# Patient Record
Sex: Female | Born: 1960 | Race: White | Hispanic: No | Marital: Married | Smoking: Former smoker
Health system: Southern US, Community
[De-identification: ages and names within clinical notes are randomized; demographics above are authoritative.]

## PROBLEM LIST (undated history)

## (undated) DIAGNOSIS — E785 Hyperlipidemia, unspecified: Secondary | ICD-10-CM

## (undated) DIAGNOSIS — C439 Malignant melanoma of skin, unspecified: Secondary | ICD-10-CM

## (undated) HISTORY — PX: CARPAL TUNNEL RELEASE: SHX101

## (undated) HISTORY — PX: APPENDECTOMY: SHX54

## (undated) HISTORY — DX: Malignant melanoma of skin, unspecified: C43.9

---

## 2008-09-03 ENCOUNTER — Emergency Department (HOSPITAL_COMMUNITY): Admission: EM | Admit: 2008-09-03 | Discharge: 2008-09-03 | Payer: Self-pay | Admitting: Emergency Medicine

## 2009-01-20 ENCOUNTER — Emergency Department (HOSPITAL_COMMUNITY): Admission: EM | Admit: 2009-01-20 | Discharge: 2009-01-20 | Payer: Self-pay | Admitting: Emergency Medicine

## 2009-01-23 ENCOUNTER — Encounter (HOSPITAL_COMMUNITY): Admission: RE | Admit: 2009-01-23 | Discharge: 2009-04-23 | Payer: Self-pay | Admitting: Emergency Medicine

## 2010-08-14 ENCOUNTER — Emergency Department (HOSPITAL_BASED_OUTPATIENT_CLINIC_OR_DEPARTMENT_OTHER): Admission: EM | Admit: 2010-08-14 | Discharge: 2010-08-14 | Payer: Self-pay | Admitting: Emergency Medicine

## 2011-09-15 ENCOUNTER — Other Ambulatory Visit (HOSPITAL_COMMUNITY)
Admission: RE | Admit: 2011-09-15 | Discharge: 2011-09-15 | Disposition: A | Discharge status: Home / Self Care | Payer: BC Managed Care – PPO | Source: Ambulatory Visit | Attending: Internal Medicine | Admitting: Internal Medicine

## 2011-09-15 DIAGNOSIS — Z01419 Encounter for gynecological examination (general) (routine) without abnormal findings: Secondary | ICD-10-CM | POA: Insufficient documentation

## 2011-11-16 ENCOUNTER — Telehealth: Payer: Self-pay | Admitting: Internal Medicine

## 2011-11-16 ENCOUNTER — Encounter: Payer: Self-pay | Admitting: Internal Medicine

## 2011-11-16 ENCOUNTER — Ambulatory Visit (INDEPENDENT_AMBULATORY_CARE_PROVIDER_SITE_OTHER): Payer: Self-pay | Admitting: Internal Medicine

## 2011-11-16 VITALS — BP 104/82 | HR 69 | Temp 98.3°F | Ht 64.0 in | Wt 159.4 lb

## 2011-11-16 DIAGNOSIS — R490 Dysphonia: Secondary | ICD-10-CM

## 2011-11-16 DIAGNOSIS — J986 Disorders of diaphragm: Secondary | ICD-10-CM

## 2011-11-16 DIAGNOSIS — R0602 Shortness of breath: Secondary | ICD-10-CM

## 2011-11-16 NOTE — Patient Instructions (Signed)
Unclear what is causing your problems Please have CT chest and neck with contrast for hoarseness and left dipraghm elevation I will call you with results to decide next step

## 2011-11-16 NOTE — Telephone Encounter (Signed)
Contacted pt, she forgot to mention that she fell on left side x 1 month ago. States, a cxr was done on her left ankle and found nothing. Ibuprofen helps with pain. Still having sob and patient has a CT scan scheduled for tomorrow.  Please advise if ok to go back to work. Thanks, Huntley Dec

## 2011-11-16 NOTE — Progress Notes (Signed)
Subjective:    Patient ID: Virginia Richardson, female    DOB: 28-Apr-1961, 51 y.o.   MRN: [id number]  HPI  IOV 11/16/2011 51 year old female. Works in [employer]. No medical issues. Remote smoker - quit 1984 after 8ppd smoking hx.  Reports multiple visits to [facility] to attend to sick mom (UTIs).   Reports dyspnea x 1 month but has gained weight due to hospital food as visitor. Due to mom'sillness did not have time to seek medical attention. Insidious onset. 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 Improved with rest. Stable since onset. Denies associated cough, wheezing, sputum, orthopnea, paroxysmal nocturnal dyspnea, chest pain. However, past few days notices some discomfort in Left infra-axillary region when she exerts or some tightness when she inhales deeply. Also associated hoarseness since 1 month  Mom got out of [facility] on 11/11/11 after opd procedure with supra public foley. Then on 11/12/11 Saturday felt she might be getting acutely ill. Then next day 11/13/11 Sunday nausea, unable to eat, vomiting that persisted on 11/14/11. Yesterday 11/15/11 spent time taking bed rest but noticed after cleaning up old stew off refrigerator. After walking 193feeet for that felt that head was going to split with headache and had nausea. Says last several days not eaten much. Eating only crackers. Taking anti-nausea meds and is helping. Today only had mash potatoes. Of note no fever per hx since Monday 11/14/11  Saw NP Lauretta Chester yesterday and CXR 11/25/11 at DR Kim's office shows new LEFT DIAPHRAGM ELEVATION compared to feb 2012 which was normal at that time (does not remember why cxr was done 1 year ago). CBC was normal. BMET normal. DC on Zpak with referral here.   Social: 63 year old mom. 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  Family: 2 catts. No kids. Spouse + who is non-smoker   Review of Systems  Constitutional: Negative for fever and unexpected weight change.    HENT: Negative for ear pain, nosebleeds, congestion, sore throat, rhinorrhea, sneezing, trouble swallowing, dental problem, postnasal drip and sinus pressure.   Eyes: Negative for redness and itching.  Respiratory: Positive for shortness of breath. Negative for cough, chest tightness and wheezing.   Cardiovascular: Negative for palpitations and leg swelling.  Gastrointestinal: Positive for abdominal pain. Negative for nausea and vomiting.  Genitourinary: Negative for dysuria.  Musculoskeletal: Negative for joint swelling.  Skin: Negative for rash.  Neurological: Positive for headaches.  Hematological: Does not bruise/bleed easily.  Psychiatric/Behavioral: Positive for dysphoric mood. The patient is nervous/anxious.        Objective:   Physical Exam  Vitals reviewed. Constitutional: She is oriented to person, place, and time. She appears well-developed and well-nourished. No distress.       Body mass index is 27.36 kg/(m^2).   HENT:  Head: Normocephalic and atraumatic.  Right Ear: External ear normal.  Left Ear: External ear normal.  Mouth/Throat: Oropharynx is clear and moist. No oropharyngeal exudate.  Eyes: Conjunctivae and EOM are normal. Pupils are equal, round, and reactive to light. Right eye exhibits no discharge. Left eye exhibits no discharge. No scleral icterus.  Neck: Normal range of motion. Neck supple. No JVD present. No tracheal deviation present. No thyromegaly present.  Cardiovascular: Normal rate, regular rhythm, normal heart sounds and intact distal pulses.  Exam reveals no gallop and no friction rub.   No murmur heard. Pulmonary/Chest: Effort normal and breath sounds normal. No respiratory distress. She has no wheezes.  She has no rales. She exhibits no tenderness.       Left sided base chest sounds diminished and dull   Abdominal: Soft. Bowel sounds are normal. She exhibits no distension and no mass. There is no tenderness. There is no rebound and no guarding.   Musculoskeletal: Normal range of motion. She exhibits no edema and no tenderness.  Lymphadenopathy:    She has no cervical adenopathy.  Neurological: She is alert and oriented to person, place, and time. She has normal reflexes. No cranial nerve deficit. She exhibits normal muscle tone. Coordination normal.  Skin: Skin is warm and dry. No rash noted. She is not diaphoretic. No erythema. No pallor.  Psychiatric: She has a normal mood and affect. Her behavior is normal. Judgment and thought content normal.          Assessment & Plan:

## 2011-11-16 NOTE — Telephone Encounter (Signed)
Please change to CT angio chest rule out PE. Otherwise all same ct neck and ct chest. I tried to call her but got only voice mail and left message with details

## 2011-11-16 NOTE — Telephone Encounter (Signed)
Order placed for CT Angio Chest.  Will hold in triage to call pt tomorrow.  CT is closed.

## 2011-11-17 ENCOUNTER — Ambulatory Visit (INDEPENDENT_AMBULATORY_CARE_PROVIDER_SITE_OTHER)
Admission: RE | Admit: 2011-11-17 | Discharge: 2011-11-17 | Disposition: A | Discharge status: Home / Self Care | Payer: BC Managed Care – PPO | Source: Ambulatory Visit | Attending: Internal Medicine | Admitting: Internal Medicine

## 2011-11-17 ENCOUNTER — Other Ambulatory Visit: Payer: Self-pay

## 2011-11-17 ENCOUNTER — Telehealth: Payer: Self-pay | Admitting: Internal Medicine

## 2011-11-17 DIAGNOSIS — R490 Dysphonia: Secondary | ICD-10-CM

## 2011-11-17 DIAGNOSIS — J986 Disorders of diaphragm: Secondary | ICD-10-CM

## 2011-11-17 DIAGNOSIS — R0602 Shortness of breath: Secondary | ICD-10-CM

## 2011-11-17 MED ORDER — IOHEXOL 300 MG/ML  SOLN
80.0000 mL | Freq: Once | INTRAMUSCULAR | Status: AC | PRN
  Administered 2011-11-17: 80 mL via INTRAVENOUS

## 2011-11-17 NOTE — Telephone Encounter (Signed)
Order sent to [facility] for sniff test and referral to ENT

## 2011-11-17 NOTE — Telephone Encounter (Signed)
Triage,   Sending this to you in absence of Victorino Dike and me being out this pm and nature of testing. I informed husband of results because patient was driving.  Husbnad will talk to patient. 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 Recc laryngeal nerve area is normal G)  Left lower lobe atelectasis present.  Ground-glass opacities dependently in the lower lungs, likely  dependent atelectasis  PLAN (tirage to implement)   -set up sniff test for diaphragm left paralysis- radiology at [facility] or [place]  - ENT office eval [facility] or Dr Suszanne Conners ASAP - next few days if possible

## 2011-11-17 NOTE — Telephone Encounter (Signed)
Virginia Richardson is already aware of the change in CT's per phone call with [facility].    Called spoke with patient, she stated that she did receive MR's message about the change in CT's.  Nothing further needed.

## 2011-11-17 NOTE — Telephone Encounter (Signed)
Pt scheduled for sniff test@[facility]  11/18/11@12 :30pm and appt to see dr Berdine Addison  ent 11/21/11@9 :00am pt is aware of these appt

## 2011-11-17 NOTE — Telephone Encounter (Signed)
Per previous phone note from MR, he wanted pt set up for a sniff test and ENT referral asap, which i have already put both orders in to [facility].  Called and spoke with pt and informed her of this and that she will be getting a call from our [facility] with appt dates/times for these two orders.  Pt was ok with this but asked that PCCs call her directly on her cell [phone] with the information.  PCCs, please advise.  Thanks.

## 2011-11-18 ENCOUNTER — Ambulatory Visit (HOSPITAL_COMMUNITY)
Admission: RE | Admit: 2011-11-18 | Discharge: 2011-11-18 | Disposition: A | Discharge status: Home / Self Care | Payer: BC Managed Care – PPO | Source: Ambulatory Visit | Attending: Internal Medicine | Admitting: Internal Medicine

## 2011-11-18 ENCOUNTER — Other Ambulatory Visit: Payer: Self-pay | Admitting: Internal Medicine

## 2011-11-18 DIAGNOSIS — J986 Disorders of diaphragm: Secondary | ICD-10-CM | POA: Insufficient documentation

## 2011-11-18 DIAGNOSIS — R0602 Shortness of breath: Secondary | ICD-10-CM | POA: Insufficient documentation

## 2011-11-20 ENCOUNTER — Encounter: Payer: Self-pay | Admitting: Internal Medicine

## 2011-11-20 DIAGNOSIS — R490 Dysphonia: Secondary | ICD-10-CM | POA: Insufficient documentation

## 2011-11-20 DIAGNOSIS — J986 Disorders of diaphragm: Secondary | ICD-10-CM | POA: Insufficient documentation

## 2011-11-20 NOTE — Assessment & Plan Note (Signed)
Unclear why she woould have left diaphragm elevation. No obvious etiology. DEfinitely new since cxr 1 year ago. There is associated hoarseness too making me wonder about associated vocal cord issues. I think this is what is making her dyspneic. Not sure at this stage how to tie in nausea and gi symptoms to this; electrolytes at pmd office were normal esp sodium.. Will get Ct chest and neck and decide next step. I have explained uncertainties and broad differential diagnosis in detail to her

## 2011-11-21 ENCOUNTER — Telehealth: Payer: Self-pay | Admitting: Internal Medicine

## 2011-11-21 NOTE — Telephone Encounter (Signed)
I spoke with the pt and advised her of sniff test results. She states she saw the ENT today and asked them to fax note to [place]. She also had questions about wether she should return to work. She states she does not need a note because she is already set to be out but she was just wondering if she should go back since she works at [employer] and it is strenuous activity all day and she is short of breath. I advised if she is having symptoms then ok to stay out of work until OV on wed. Pt set to see MR on 11-23-11. [name], CMA

## 2011-11-23 ENCOUNTER — Encounter: Payer: Self-pay | Admitting: Internal Medicine

## 2011-11-23 ENCOUNTER — Ambulatory Visit (INDEPENDENT_AMBULATORY_CARE_PROVIDER_SITE_OTHER): Payer: BC Managed Care – PPO | Admitting: Internal Medicine

## 2011-11-23 VITALS — BP 124/76 | HR 83 | Temp 97.0°F | Ht 64.0 in | Wt 162.0 lb

## 2011-11-23 DIAGNOSIS — S142XXA Injury of nerve root of cervical spine, initial encounter: Secondary | ICD-10-CM

## 2011-11-23 DIAGNOSIS — R490 Dysphonia: Secondary | ICD-10-CM

## 2011-11-23 DIAGNOSIS — J986 Disorders of diaphragm: Secondary | ICD-10-CM

## 2011-11-23 NOTE — Patient Instructions (Addendum)
I think you need MRI of the spine - I will talk to radiologist today and then order it I will touch base with Dr Selena Batten about yoru nausea and Dr Pollyann Kennedy My nurse will get Dr Lucky Rathke note  Stay off work till workup is complete Followup based on MRI Address insomnia at followup

## 2011-11-23 NOTE — Progress Notes (Signed)
Subjective:    Patient ID: Virginia Richardson, female    DOB: Mar 30, 1961, 51 y.o.   MRN: [id number]  HPI  OV 11/16/2011 51 year old female. Works in [employer]. No medical issues. Remote smoker - quit 1984 after 8ppd smoking hx.  Reports multiple visits to [facility] to attend to sick mom (UTIs).   Reports dyspnea x 1 month but has gained weight due to hospital food as visitor. Due to mom'sillness did not have time to seek medical attention. Insidious onset. 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 Improved with rest. Stable since onset. Denies associated cough, wheezing, sputum, orthopnea, paroxysmal nocturnal dyspnea, chest pain. However, past few days notices some discomfort in Left infra-axillary region when she exerts or some tightness when she inhales deeply. Also associated hoarseness since 1 month  Mom got out of [facility] on 11/11/11 after opd procedure with supra public foley. Then on 11/12/11 Saturday felt she might be getting acutely ill. Then next day 11/13/11 Sunday nausea, unable to eat, vomiting that persisted on 11/14/11. Yesterday 11/15/11 spent time taking bed rest but noticed after cleaning up old stew off refrigerator. After walking 166feeet for that felt that head was going to split with headache and had nausea. Says last several days not eaten much. Eating only crackers. Taking anti-nausea meds and is helping. Today only had mash potatoes. Of note no fever per hx since Monday 11/14/11  Saw NP Lauretta Chester yesterday and CXR 11/25/11 at DR Kim's office shows new LEFT DIAPHRAGM ELEVATION compared to feb 2012 which was normal at that time (does not remember why cxr was done 1 year ago). CBC was normal. BMET normal. DC on Zpak with referral here.   Social: 90 year old mom. 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  Family: 2 catts. No kids. Spouse + who is non-smoker    OV 11/23/2011  FU dyspnea: still dyspneic. Stil with nausea esp with exposure to  certain foods. No vomiting.  Saw Dr Pollyann Kennedy ENT and per her hx: ENT eval was normal (note not available). Now recollects: that on 10/15/11 tripped and fell missing stairs fell on left side outstretched hand. Initially left ankle sprain and saw Dr Selena Batten 10/17/11 for left ankle sprain (xray negative per hx). Then around 10/24/11 woke up one morning and noticed left shoulder ache - sudden onset when she woke up. Underwent massage x 2 and chiropractor x 2 ( around xmas time to neck and back; felt crack noise). Pain still present but has moved to left supra-mammary area and husband feels this area is more pronounced. Dyspnea noticed after the fall but husband says intermittent nausea mild on and off several months.    Review of Systems  Constitutional: Positive for fever and unexpected weight change.  HENT: Positive for sore throat and sneezing. Negative for ear pain, nosebleeds, congestion, rhinorrhea, trouble swallowing, dental problem, postnasal drip and sinus pressure.   Eyes: Negative for redness and itching.  Respiratory: Positive for cough, chest tightness, shortness of breath and wheezing.   Cardiovascular: Negative for palpitations and leg swelling.  Gastrointestinal: Positive for nausea and vomiting.  Genitourinary: Negative for dysuria.  Musculoskeletal: Negative for joint swelling.  Skin: Negative for rash.  Neurological: Positive for headaches.  Hematological: Does not bruise/bleed easily.  Psychiatric/Behavioral: Negative for dysphoric mood. The patient is nervous/anxious.        Objective:   Physical Exam  Vitals reviewed. Constitutional: She is oriented to person,  place, and time. She appears well-developed and well-nourished. No distress.  HENT:  Head: Normocephalic and atraumatic.  Right Ear: External ear normal.  Left Ear: External ear normal.  Mouth/Throat: Oropharynx is clear and moist. No oropharyngeal exudate.  Eyes: Conjunctivae and EOM are normal. Pupils are equal, round,  and reactive to light. Right eye exhibits no discharge. Left eye exhibits no discharge. No scleral icterus.  Neck: Normal range of motion. Neck supple. No JVD present. No tracheal deviation present. No thyromegaly present.  Cardiovascular: Normal rate, regular rhythm, normal heart sounds and intact distal pulses.  Exam reveals no gallop and no friction rub.   No murmur heard. Pulmonary/Chest: Effort normal. No respiratory distress. She has no wheezes. She has no rales. She exhibits no tenderness.       Decreased breath sound on left  Abdominal: Soft. Bowel sounds are normal. She exhibits no distension and no mass. There is no tenderness. There is no rebound and no guarding.  Musculoskeletal: Normal range of motion. She exhibits no edema and no tenderness.  Lymphadenopathy:    She has no cervical adenopathy.  Neurological: She is alert and oriented to person, place, and time. She has normal reflexes. No cranial nerve deficit. She exhibits normal muscle tone. Coordination normal.  Skin: Skin is warm and dry. No rash noted. She is not diaphoretic. No erythema. No pallor.  Psychiatric: She has a normal mood and affect. Her behavior is normal. Judgment and thought content normal.          Assessment & Plan:

## 2011-11-24 ENCOUNTER — Ambulatory Visit (HOSPITAL_COMMUNITY)
Admission: RE | Admit: 2011-11-24 | Discharge: 2011-11-24 | Disposition: A | Discharge status: Home / Self Care | Payer: BC Managed Care – PPO | Source: Ambulatory Visit | Attending: Internal Medicine | Admitting: Internal Medicine

## 2011-11-24 DIAGNOSIS — S142XXA Injury of nerve root of cervical spine, initial encounter: Secondary | ICD-10-CM

## 2011-11-24 DIAGNOSIS — M542 Cervicalgia: Secondary | ICD-10-CM | POA: Insufficient documentation

## 2011-11-24 DIAGNOSIS — M5380 Other specified dorsopathies, site unspecified: Secondary | ICD-10-CM | POA: Insufficient documentation

## 2011-11-24 DIAGNOSIS — J986 Disorders of diaphragm: Secondary | ICD-10-CM | POA: Insufficient documentation

## 2011-11-25 ENCOUNTER — Inpatient Hospital Stay (HOSPITAL_COMMUNITY): Admission: RE | Admit: 2011-11-25 | Payer: BC Managed Care – PPO | Source: Ambulatory Visit

## 2011-11-25 ENCOUNTER — Telehealth: Payer: Self-pay | Admitting: Internal Medicine

## 2011-11-25 ENCOUNTER — Encounter: Payer: Self-pay | Admitting: Internal Medicine

## 2011-11-25 DIAGNOSIS — R0600 Dyspnea, unspecified: Secondary | ICD-10-CM

## 2011-11-25 NOTE — Telephone Encounter (Signed)
Spouse Ron is aware that PFT has been ordered AND  Cardiopulmonary is closed for today. This will be set up on Mon., 1/21 and the PCC's will call with that date and time.

## 2011-11-25 NOTE — Telephone Encounter (Signed)
MRI essentuially normal. Tried to get hold of Dr Pollyann Kennedy and could not. Advised patient (d/w PMD Dr Selena Batten as well  1. Diaph paralysis  - idiopathic 2. Offered [facility] 2nd opinion with Dr Katrine Coho -she did not take it  3. Recommended rehab and weight loss - she is interested in it. Will refer to pulmonary rehab. She prefers to work at [employer] and do rehab 4. Nausea improving - wil follow with Dr Marzetta Board,  1. Please give appt to see me in 2-3 months 2. Refer to pulmonary rehab for dyspnea, diaph paralysis - mark urgent in referral with note that I want patient to move to top of queue for rehab 3. Please get copy of OV note of Dr Pollyann Kennedy (I called his office and none picked up) ENT  Thanks  MR

## 2011-11-25 NOTE — Assessment & Plan Note (Addendum)
Unclear etiology for left diaphragm weakness. Given hx of fall and neck manipulation will get MRI C Spine rule out C3 disease. Explained dx uncertainty and discussed with her and husband. Agreeable to plan. Unable to explain nausea to this; will defer to Dr Selena Batten   > 50% of this > 15 min visit spent in face to face counseling

## 2011-11-25 NOTE — Telephone Encounter (Signed)
Answered all questions. Hold off on work. He is expressing significant dyspnea even with minimal exertion like lifting simple objects or moving short distances. Explained that is not usual. Therefore, needs workup for dyspnea beyond current workup. He is agreeable. Also, stay off work  1. Do full PFT with MIP and MEP and resting ABG 2. Do walking test (simple) for oxygen levels   DO in PFT lab early next week. Le me know once results are available for review. Leave on my desk

## 2011-11-25 NOTE — Telephone Encounter (Signed)
Pt's spouse Ron requests to speak to MR re: pt. [phone]

## 2011-11-25 NOTE — Telephone Encounter (Signed)
Late add: Ron is aware MR is out of the office and is okay to wait for call back @ [phone].

## 2011-11-25 NOTE — Assessment & Plan Note (Signed)
Will obtain records from ENT

## 2011-11-25 NOTE — Telephone Encounter (Signed)
Called spoke with Ron who has some questions/concerns he would like to discuss with MR:  1) He did some research about diaphragm paralysis for other possible causes...viral, bacterial, MS, ALS, muscular dystrophy.  Are any of these possibilties? 2) How high did the MRI imaging go on the spine? 3) Is there a chance that it could happen to the other side? 4) Will this condition heal on its own? 5) What therapies can be done to assist with healing/rehab? 6) MR mentioned a 2nd opinion, would like to discuss if it's really a good idea... 7) Patient wants to return to work.  Will going back to work aggravate her condition? 8) Patient had some DOE while they were rushing to the car in the inclement weather on Thursday.  Ron requesting to speak with MR directly about these issues.  Will forward to MR.

## 2011-11-28 ENCOUNTER — Telehealth: Payer: Self-pay | Admitting: Internal Medicine

## 2011-11-28 DIAGNOSIS — J986 Disorders of diaphragm: Secondary | ICD-10-CM

## 2011-11-28 NOTE — Telephone Encounter (Signed)
Spoke to patient. Says when she lies on right side sicne 1/18 there is pressure from left side coming to right side. Having GERD -- new since 11/25/11 - anything she eats, constant. Nausea and Vomit have resolved.   In addition, she has the tightness under left mammary area - new since 2 days.  Dyspnea is the same since 2 weeks. Mom has hx of DVT and possibly PE. CT angio 11/17/11  - No PE  PLAN Yes, agree trying omeprazole OTC 20 or 40mg  daily along with ranitidine 300mg  OTC at night for GERD. Also suggested she talk to Dr Salina April about it  Above communicated to patient  Please organize my  pft and MIP and walking test - page/email  me once done

## 2011-11-28 NOTE — Telephone Encounter (Signed)
[email or web address].Virginia Richardson, CMA  

## 2011-11-28 NOTE — Telephone Encounter (Signed)
I spoke with the pt husband and he states that the pt pain on her left side from her left breast to her diaphragm is worse then it has been and she is having increased SOB as well. He also states that she is still not sleeping and she is also having acid reflux. I advised per last ov note he would discuss her insomnia at follow-up. I advised to try omeprazole for the reflux and they can discuss this at follow-up as well. He then mentioned the pt was to have a PFT but he has not heard anything from this.  Looking through the chart the PFT order was placed incorrectly so I have corrected it and advised we will call with appt tomorrow for this. I advised if the pt symptoms get worse to go to urgent care or ER.   I advised I will send a message to MR to see if he has any recs for the patient. Please advise. [name], CMA

## 2011-11-28 NOTE — Telephone Encounter (Signed)
Pt's husband, Ron,  returned triage's call at his wife's request & asked to be reached at [phone].  [name]

## 2011-11-29 NOTE — Telephone Encounter (Signed)
Order placed by Victorino Dike for PFT,MI[MEP. Scheduled for 11/30/11 @ [facility]. Will forward to MR so he is aware.

## 2011-11-30 ENCOUNTER — Emergency Department (HOSPITAL_COMMUNITY): Payer: BC Managed Care – PPO

## 2011-11-30 ENCOUNTER — Ambulatory Visit (HOSPITAL_COMMUNITY)
Admission: RE | Admit: 2011-11-30 | Discharge: 2011-11-30 | Disposition: A | Discharge status: Home / Self Care | Payer: BC Managed Care – PPO | Source: Ambulatory Visit

## 2011-11-30 ENCOUNTER — Ambulatory Visit (HOSPITAL_COMMUNITY)
Admission: RE | Admit: 2011-11-30 | Discharge: 2011-11-30 | Disposition: A | Discharge status: Home / Self Care | Payer: BC Managed Care – PPO | Source: Ambulatory Visit | Attending: Internal Medicine | Admitting: Internal Medicine

## 2011-11-30 ENCOUNTER — Ambulatory Visit (HOSPITAL_COMMUNITY): Admission: RE | Admit: 2011-11-30 | Payer: BC Managed Care – PPO | Source: Ambulatory Visit

## 2011-11-30 ENCOUNTER — Emergency Department (HOSPITAL_COMMUNITY)
Admission: EM | Admit: 2011-11-30 | Discharge: 2011-11-30 | Disposition: A | Discharge status: Home / Self Care | Payer: BC Managed Care – PPO | Attending: Emergency Medicine | Admitting: Emergency Medicine

## 2011-11-30 ENCOUNTER — Other Ambulatory Visit: Payer: Self-pay

## 2011-11-30 DIAGNOSIS — J986 Disorders of diaphragm: Secondary | ICD-10-CM | POA: Insufficient documentation

## 2011-11-30 DIAGNOSIS — R55 Syncope and collapse: Secondary | ICD-10-CM

## 2011-11-30 DIAGNOSIS — Z85820 Personal history of malignant melanoma of skin: Secondary | ICD-10-CM | POA: Insufficient documentation

## 2011-11-30 LAB — CBC
HCT: 40.6 % (ref 36.0–46.0)
Hemoglobin: 14.1 g/dL (ref 12.0–15.0)
MCH: 31 pg (ref 26.0–34.0)
MCHC: 34.7 g/dL (ref 30.0–36.0)
MCV: 89.2 fL (ref 78.0–100.0)
RDW: 13.4 % (ref 11.5–15.5)

## 2011-11-30 LAB — BLOOD GAS, ARTERIAL
Acid-base deficit: 0 mmol/L (ref 0.0–2.0)
Bicarbonate: 24 mEq/L (ref 20.0–24.0)
FIO2: 0.21 %
O2 Saturation: 94.4 %
Patient temperature: 98.6
TCO2: 21.2 mmol/L (ref 0–100)
pO2, Arterial: 73.2 mmHg — ABNORMAL LOW (ref 80.0–100.0)

## 2011-11-30 LAB — DIFFERENTIAL
Basophils Relative: 0 % (ref 0–1)
Eosinophils Absolute: 0.1 10*3/uL (ref 0.0–0.7)
Eosinophils Relative: 1 % (ref 0–5)
Lymphs Abs: 2 10*3/uL (ref 0.7–4.0)
Monocytes Absolute: 0.6 10*3/uL (ref 0.1–1.0)

## 2011-11-30 LAB — COMPREHENSIVE METABOLIC PANEL
Albumin: 4.5 g/dL (ref 3.5–5.2)
BUN: 20 mg/dL (ref 6–23)
CO2: 27 mEq/L (ref 19–32)
GFR calc Af Amer: 71 mL/min — ABNORMAL LOW (ref >90)
GFR calc non Af Amer: 62 mL/min — ABNORMAL LOW (ref >90)
Glucose, Bld: 107 mg/dL — ABNORMAL HIGH (ref 70–99)
Sodium: 139 mEq/L (ref 135–145)
Total Bilirubin: 0.2 mg/dL — ABNORMAL LOW (ref 0.3–1.2)

## 2011-11-30 NOTE — ED Notes (Signed)
Pt sent over from Outpatient d/t pt was getting a blood gas done and had a ?syncopal episode with ?seizure activities. "Pt slummed over drooling with shaking motion", pt now a/o x4.

## 2011-11-30 NOTE — ED Notes (Signed)
ZOX:[id number]<AV> Expected date:11/30/11<BR> Expected time: 1:47 PM<BR> Means of arrival:Other<BR> Comments:<BR> Hold from pt at outpt

## 2011-11-30 NOTE — ED Provider Notes (Signed)
History     CSN: [id number]  Arrival date & time 11/30/11  1358   First MD Initiated Contact with Patient 11/30/11 1500      Chief Complaint  Patient presents with  . Loss of Consciousness    ?seizure, no hx;     (Consider location/radiation/quality/duration/timing/severity/associated sxs/prior treatment) HPI Patient presents from MD's office following a syncopal episode.  She is being evaluated for new diaphragm dysfunction.  Immediately after an ABG the patient developed lh, a warm/flushed sensation and then was unconscious for ~30seconds.  She awoke "normal" w/o any cp/ha/nausea or any other focal complaints.  She now states that she is back to baseline, and has been so since the event. No recent illness, but she has developed diaphragmatic dysfunction.  She is being evaluated for this new development by pulmonology. Past Medical History  Diagnosis Date  . Melanoma     Past Surgical History  Procedure Date  . Appendectomy     Family History  Problem Relation Age of Onset  . COPD Mother   . COPD Father   . Allergies Mother   . Asthma Mother   . Asthma Father   . Heart disease Father   . Heart disease Mother     History  Substance Use Topics  . Smoking status: Former Smoker -- 1.0 packs/day for 8 years    Types: Cigarettes    Quit date: 11/07/1982  . Smokeless tobacco: Not on file  . Alcohol Use: No    OB History    Grav Para Term Preterm Abortions TAB SAB Ect Mult Living                  Review of Systems  Constitutional:       HPI  HENT:       HPI otherwise negative  Eyes: Negative.   Respiratory:       HPI, otherwise negative  Cardiovascular:       HPI, otherwise nmegative  Gastrointestinal: Negative for vomiting.  Genitourinary:       HPI, otherwise negative  Musculoskeletal:       HPI, otherwise negative  Skin: Negative.   Neurological: Negative for seizures, syncope, speech difficulty and headaches.    Allergies  Review of patient's  allergies indicates no known allergies.  Home Medications   Current Outpatient Rx  Name Route Sig Dispense Refill  . OMEGA-3 FATTY ACIDS 1000 MG PO CAPS Oral Take 2 g by mouth daily.    . IBUPROFEN 200 MG PO TABS Oral Take 400 mg by mouth every 6 (six) hours as needed. pain    . NON FORMULARY Oral Take 20 mg by mouth daily. Vibryd      BP 120/86  Pulse 75  Resp 22  SpO2 100%  Physical Exam  Nursing note and vitals reviewed. Constitutional: She is oriented to person, place, and time. She appears well-developed and well-nourished. No distress.  HENT:  Head: Normocephalic and atraumatic.  Eyes: Conjunctivae and EOM are normal.  Cardiovascular: Normal rate and regular rhythm.   Pulmonary/Chest: Effort normal and breath sounds normal. No stridor. No respiratory distress.  Abdominal: She exhibits no distension.  Musculoskeletal: She exhibits no edema.  Neurological: She is alert and oriented to person, place, and time. She has normal strength. No cranial nerve deficit or sensory deficit. She displays a negative Romberg sign. Coordination and gait normal.  Skin: Skin is warm and dry.  Psychiatric: She has a normal mood and affect.  ED Course  Procedures (including critical care time)   Cardiac Monitor: 70 SR abnormal  Pulse Ox 100% RA - normal      Labs Reviewed  CBC  DIFFERENTIAL  COMPREHENSIVE METABOLIC PANEL   No results found.   No diagnosis found.   W recent neuro changes, will r/o IC mass vs. dysrhythmia   Date: 11/30/2011  Rate: 60  Rhythm: normal sinus rhythm  QRS Axis: normal  Intervals: normal  ST/T Wave abnormalities: normal  Conduction Disutrbances:none  Narrative Interpretation:   Old EKG Reviewed: none available NORMAL  CT/CXR both reviewed by me.  XR notable for elevated L hemi-diaphragm (c/w phrenic nerve dysfunction)   MDM  This 51yo F presents following a syncopal episode.  Her description of a prodrome, which began after a blood  draw, and her return to baseline following the event are reassuring for vagal-mediated syncope.  Given the patient's new neuro changes she had CT as part of her eval.  The CT, and her labs/cxr/ecg were all reassuring.  Per [facility] Syncope Criteria she is low-risk for adverse outcomes.  She was made aware of all findings and was d/c in stable condition (to f/u with both her PMD, and cardiology w consideration of Holter monitoring).        Gerhard Munch, MD 11/30/11 [phone]

## 2011-11-30 NOTE — Progress Notes (Signed)
Out patient sent for ABG, PFT and walk test.  Francia Greaves, RRT and Berlinda Last were the therapist with the patient during the following event:  During first test (ABG), pt tolerated ABG stick well.  3 minutes after the ABG was drawn, pt c/o feeling "warm" and "not feeling good".  Immediately, pt went unresponsive, slumped over, began drooling and shaking, pt skin color also became very pale-white.  Shaking episode lasted about 10 seconds.  Pt regained responsiveness immediately after shaking stopped.  Once awake, pt c/o nausea, and headache, and head feeling "heavy".  During the shaking episode, pt was monitored, while the Rapid Response Team was immediately activated.  Pt maintained adequate respirations t/o episode.  Rapid Response Team arrived (Nicole-RN, Veterinary surgeon) and evaluated pt. Pt Vital signs immediately post shaking were: Sat 98-100% on room air, HR 62-67, BP 129/78 (MAP 95).  Dr. Marchelle Gearing was also notified immediately after shaking episode (above info) and recommended for pt to go to ED.  ED called to arrange for pt to be brought over for eval.  VSS t/o monitoring pt and waiting for ED rm 5 to become available.  Report given to ED RN's: [place] RN, and Diane RN -Consulting civil engineer.  No further shaking episodes noted, VSS, no distress noted upon transferring pt to ED.

## 2011-11-30 NOTE — ED Notes (Signed)
Pt was at outpatient RT for tests the MD sent her in for.  After the ABG's were done she felt light headed and for about 10 sec per RT she was out and drooling and would not come around.  They called the Rapid Respose Team and she came to before they got there so they left and her pulmonologist sent her down here for further testing

## 2011-12-02 ENCOUNTER — Other Ambulatory Visit: Payer: Self-pay | Admitting: Internal Medicine

## 2011-12-02 DIAGNOSIS — R270 Ataxia, unspecified: Secondary | ICD-10-CM

## 2011-12-05 ENCOUNTER — Telehealth: Payer: Self-pay | Admitting: Internal Medicine

## 2011-12-05 NOTE — Telephone Encounter (Signed)
She can have PFT and walk anytime. SHould not interfere with cards eval. Pls give her appt to see me after her cards visit. Dates of chiropractor noted. Will keep it in mind during workup

## 2011-12-05 NOTE — Telephone Encounter (Signed)
Spoke with pt. She states had PFT done 11/30/11, but the test was not completed b/c she "passed out"- was sent to ED and had cxr, ct head and told to f/u with cards. She has appt with Dr. Nadara Eaton this month and wants to know if MR wants to her reschedule the PFT before or after she sees cards. She also wanted to let MR know the dates that she saw her chiropractor last yr and they were 7/23, 7/24, 10/12, 12/18, and 12/19. Please advise, thanks!

## 2011-12-05 NOTE — Telephone Encounter (Signed)
lmomtcb x1 

## 2011-12-05 NOTE — Telephone Encounter (Signed)
Spoke with pt and gave number to call to reschedule PFT at [facility]. She was scheduled appt with MR for 12/23/11.

## 2011-12-07 ENCOUNTER — Other Ambulatory Visit: Payer: BC Managed Care – PPO

## 2011-12-09 ENCOUNTER — Ambulatory Visit (HOSPITAL_COMMUNITY)
Admission: RE | Admit: 2011-12-09 | Discharge: 2011-12-09 | Disposition: A | Discharge status: Home / Self Care | Payer: BC Managed Care – PPO | Source: Ambulatory Visit | Attending: Internal Medicine | Admitting: Internal Medicine

## 2011-12-09 ENCOUNTER — Telehealth: Payer: Self-pay | Admitting: Internal Medicine

## 2011-12-09 DIAGNOSIS — R0609 Other forms of dyspnea: Secondary | ICD-10-CM | POA: Insufficient documentation

## 2011-12-09 DIAGNOSIS — R0989 Other specified symptoms and signs involving the circulatory and respiratory systems: Secondary | ICD-10-CM | POA: Insufficient documentation

## 2011-12-09 LAB — PULMONARY FUNCTION TEST

## 2011-12-09 MED ORDER — ALBUTEROL SULFATE (5 MG/ML) 0.5% IN NEBU
2.5000 mg | INHALATION_SOLUTION | Freq: Once | RESPIRATORY_TRACT | Status: AC
  Administered 2011-12-09: 2.5 mg via RESPIRATORY_TRACT

## 2011-12-09 NOTE — Telephone Encounter (Signed)
Sin respiratory is closed will forward to jennifer since triage doesn't track down results

## 2011-12-09 NOTE — Telephone Encounter (Signed)
I spoke with spouse and he states pt had PFT done today. He states MR advised them it depends on what the tests had showed. He states he was told it takes about a month to get in their. Please advise MR, thanks

## 2011-12-09 NOTE — Telephone Encounter (Signed)
Please have it faxed to my desk now or they can fax to elink next week after 3pm. So, I can take a look and call patient back. SHe is supposed to see cards as well -Dr Jacinto Halim

## 2011-12-12 NOTE — Telephone Encounter (Signed)
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  Rosann Auerbach stated that they sent a fax previously & that we have not responded.  Please calll w/ a status update at [phone] ext [phone].  [name]

## 2011-12-12 NOTE — Telephone Encounter (Signed)
Trish w/ [employer] aware MR completed disability forms and per Victorino Dike she will send this back down to [employer].  Marcelino Duster at [facility] will fax over PFT results from Fri., 12/09/11 and we will then fax to MR at [facility], fax # [phone].  Pls advise.

## 2011-12-13 NOTE — Telephone Encounter (Signed)
PFts reviewd. 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  I want to see her in office after conclusion of cardiology visit. Of note walk test did not show drop in oxygen

## 2011-12-13 NOTE — Telephone Encounter (Signed)
lmomtcb x1 

## 2011-12-13 NOTE — Telephone Encounter (Signed)
Pt's spouse made aware and pt will follow-up with MR on 12/23/11 to discuss next steps in therapy.

## 2011-12-20 ENCOUNTER — Telehealth: Payer: Self-pay | Admitting: Internal Medicine

## 2011-12-20 NOTE — Telephone Encounter (Signed)
Per phone note on 12/12/11, MR completed paperwork and it was sent back down to [employer] to be sent out.  LMOM for Trish informing her of this and if she has an further questions regarding this she would need to contact [employer], which i left their phone # and extension.

## 2011-12-21 ENCOUNTER — Telehealth: Payer: Self-pay | Admitting: Internal Medicine

## 2011-12-21 NOTE — Telephone Encounter (Signed)
Patient came by [facility] to check on FMLA and Physician Statement forms. She was told by Elease Hashimoto in [facility] that they had not received these forms back from MR. However, FMLA form has been completed and scanned into pt's chart and I printed a copy of this and gave it to the patient. Elease Hashimoto says she sent the Physician Statement up for MR to complete on 12/14/11 in an interoffice envelope. Will forward msg to MR to see if he still has this form. Patient would like callback today regarding this matter at [phone]. Patient is scheduled for f/u with MR on Fri., 12/23/11 @ 2:45pm. MR, pls advise.

## 2011-12-21 NOTE — Telephone Encounter (Signed)
lmomtcb x1 

## 2011-12-21 NOTE — Telephone Encounter (Signed)
Paperwork completed and form faxed. Pt aware.[name], CMA

## 2011-12-23 ENCOUNTER — Encounter: Payer: Self-pay | Admitting: Internal Medicine

## 2011-12-23 ENCOUNTER — Ambulatory Visit (INDEPENDENT_AMBULATORY_CARE_PROVIDER_SITE_OTHER): Payer: BC Managed Care – PPO | Admitting: Internal Medicine

## 2011-12-23 VITALS — BP 118/72 | HR 94 | Temp 98.5°F | Ht 63.75 in | Wt 163.0 lb

## 2011-12-23 DIAGNOSIS — J986 Disorders of diaphragm: Secondary | ICD-10-CM

## 2011-12-23 DIAGNOSIS — R0602 Shortness of breath: Secondary | ICD-10-CM

## 2011-12-23 DIAGNOSIS — R0609 Other forms of dyspnea: Secondary | ICD-10-CM

## 2011-12-23 DIAGNOSIS — R490 Dysphonia: Secondary | ICD-10-CM

## 2011-12-23 DIAGNOSIS — K219 Gastro-esophageal reflux disease without esophagitis: Secondary | ICD-10-CM | POA: Insufficient documentation

## 2011-12-23 DIAGNOSIS — R0600 Dyspnea, unspecified: Secondary | ICD-10-CM

## 2011-12-23 MED ORDER — ALPRAZOLAM 0.25 MG PO TABS: 0.2500 mg | ORAL_TABLET | Freq: Three times a day (TID) | ORAL | Status: DC | PRN

## 2011-12-23 NOTE — Assessment & Plan Note (Signed)
resolved 

## 2011-12-23 NOTE — Patient Instructions (Signed)
#  shortness of breath  - this is due to left diaphragm weakness/paralysis and likely panic attack as well  - I recommend you start pulmonary rehab - nurse will make referral  - I would recommend some weight loss; goal weight is 141# (current weight is 161#) but even if you lose 10# that could help you - for panic attack try xanax 0.25mg  as needed (total 10 given, no refills). If this is helping you let me know  - I recommend seeing psychiatrist for panic attack as well but we can wait on this as discussed. You can think about this and let me know - If you wish to pursue more on shortness of breath then next step is a CPST bike test and/or referral to [facility] #Acid reflux  - continue omeprazole but on empty stomach  - take diet sheet with you #Followup  2 months

## 2011-12-23 NOTE — Progress Notes (Signed)
Subjective:    Patient ID: Virginia Richardson, female    DOB: Nov 14, 1960, 50 y.o.   MRN: [id number]  HPI OV 11/16/2011 51 year old female. Works in [employer]. No medical issues. Remote smoker - quit 1984 after 8ppd smoking hx.  Reports multiple visits to [facility] to attend to sick mom (UTIs).   Reports dyspnea x 1 month but has gained weight due to hospital food as visitor. Due to mom'sillness did not have time to seek medical attention. Insidious onset. 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 Improved with rest. Stable since onset. Denies associated cough, wheezing, sputum, orthopnea, paroxysmal nocturnal dyspnea, chest pain. However, past few days notices some discomfort in Left infra-axillary region when she exerts or some tightness when she inhales deeply. Also associated hoarseness since 1 month  Mom got out of [facility] on 11/11/11 after opd procedure with supra public foley. Then on 11/12/11 Saturday felt she might be getting acutely ill. Then next day 11/13/11 Sunday nausea, unable to eat, vomiting that persisted on 11/14/11. Yesterday 11/15/11 spent time taking bed rest but noticed after cleaning up old stew off refrigerator. After walking 166feeet for that felt that head was going to split with headache and had nausea. Says last several days not eaten much. Eating only crackers. Taking anti-nausea meds and is helping. Today only had mash potatoes. Of note no fever per hx since Monday 11/14/11  Saw NP Lauretta Chester yesterday and CXR 11/25/11 at DR Kim's office shows new LEFT DIAPHRAGM ELEVATION compared to feb 2012 which was normal at that time (does not remember why cxr was done 1 year ago). CBC was normal. BMET normal. DC on Zpak with referral here.   Social: 34 year old mom. 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  Family: 2 catts. No kids. Spouse + who is non-smoker    OV 11/23/2011  FU dyspnea: still dyspneic. Stil with nausea esp with exposure to certain  foods. No vomiting.  Saw Dr Pollyann Kennedy ENT and per her hx: ENT eval was normal (note not available). Now recollects: that on 10/15/11 tripped and fell missing stairs fell on left side outstretched hand. Initially left ankle sprain and saw Dr Selena Batten 10/17/11 for left ankle sprain (xray negative per hx). Then around 10/24/11 woke up one morning and noticed left shoulder ache - sudden onset when she woke up. Underwent massage x 2 and chiropractor x 2 ( around xmas time to neck and back; felt crack noise). Pain still present but has moved to left supra-mammary area and husband feels this area is more pronounced. Dyspnea noticed after the fall but husband says intermittent nausea mild on and off several months.   REC I think you need MRI of the spine - I will talk to radiologist today and then order it  I will touch base with Dr Selena Batten about yoru nausea and Dr Pollyann Kennedy  My nurse will get Dr Lucky Rathke note  Stay off work till workup is complete  Followup based on MRI  Address insomnia at followup  OV 12/23/2011  Fu for left diphragm weakness/paralysis  with dyspnea out of propprtion. So has had more workup and the are the following  Additional Hx: chiropractor last yr and they were 7/23, 7/24, 10/12, 12/18, and 12/19. - hx of neck mainipulation. Also 15# weight gain iin past 2 months  ENT workup: note of Dr Pollyann Kennedy reviewed. No vocal cord paralysisi ABG    Component Value Date/Time  PHART 7.407* 11/30/2011 1315   PCO2ART 38.8 11/30/2011 1315   PO2ART 73.2* 11/30/2011 1315   HCO3 24.0 11/30/2011 1315   TCO2 21.2 11/30/2011 1315   ACIDBASEDEF 0.0 11/30/2011 1315   O2SAT 94.4 11/30/2011 1315   Jan/FEb 2013: Saw Dr. Nadara Eaton  And stress test negative   12/13/11: PFts reviewd. 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   walk test did not show drop in oxygen   Here to discuss all of above. Still having exertional dyspnea for carrying cat, doing yard work, pushing grocery cart up incline. In addition, 3 attacks  of self described panic attacks - sudden, random, when by herself resting, air hunger and feeling of impending doom and spontaneously settles. No associated tingling.   Also reporting significant GERD since illness srarted. PPI not helping. Taking TUMS. Diet is not great: drinks coffee, tea, and eats fried food.   Husband very concerned  Review of Systems  Constitutional: Negative for fever and unexpected weight change.  HENT: Positive for congestion, sore throat, rhinorrhea, sneezing and postnasal drip. Negative for ear pain, nosebleeds, trouble swallowing, dental problem and sinus pressure.   Eyes: Negative for redness and itching.  Respiratory: Positive for cough, chest tightness and shortness of breath. Negative for wheezing.   Cardiovascular: Negative for palpitations and leg swelling.  Gastrointestinal: Positive for nausea. Negative for vomiting and diarrhea.  Genitourinary: Negative for dysuria.  Musculoskeletal: Negative for joint swelling.  Skin: Negative for rash.  Neurological: Positive for headaches.  Hematological: Does not bruise/bleed easily.  Psychiatric/Behavioral: Positive for dysphoric mood. The patient is nervous/anxious.        Objective:   Physical Exam Vitals reviewed. Constitutional: She is oriented to person, place, and time. She appears well-developed and well-nourished. No distress.  HENT:  Head: Normocephalic and atraumatic.  Right Ear: External ear normal.  Left Ear: External ear normal.  Mouth/Throat: Oropharynx is clear and moist. No oropharyngeal exudate.  Eyes: Conjunctivae and EOM are normal. Pupils are equal, round, and reactive to light. Right eye exhibits no discharge. Left eye exhibits no discharge. No scleral icterus.  Neck: Normal range of motion. Neck supple. No JVD present. No tracheal deviation present. No thyromegaly present.  Cardiovascular: Normal rate, regular rhythm, normal heart sounds and intact distal pulses.  Exam reveals no gallop  and no friction rub.   No murmur heard. Pulmonary/Chest: Effort normal. No respiratory distress. She has no wheezes. She has no rales. She exhibits no tenderness.       Decreased breath sound on left  Abdominal: Soft. Bowel sounds are normal. She exhibits no distension and no mass. There is no tenderness. There is no rebound and no guarding.  Musculoskeletal: Normal range of motion. She exhibits no edema and no tenderness.  Lymphadenopathy:    She has no cervical adenopathy.  Neurological: She is alert and oriented to person, place, and time. She has normal reflexes. No cranial nerve deficit. She exhibits normal muscle tone. Coordination normal.  Skin: Skin is warm and dry. No rash noted. She is not diaphoretic. No erythema. No pallor.  Psychiatric: She has a normal mood and affect. Her behavior is normal. Judgment and thought content normal.          Assessment & Plan:

## 2011-12-23 NOTE — Assessment & Plan Note (Addendum)
#  Acid reflux  - continue omeprazole but on empty stomach  - take diet sheet with you - gi referrral if above fail #Followup  2 months

## 2011-12-23 NOTE — Assessment & Plan Note (Signed)
#  shortness of breath  - this is due to left diaphragm weakness/paralysis and likely panic attack as well  - I recommend you start pulmonary rehab - nurse will make referral  - I would recommend some weight loss; goal weight is 141# for bMI 25 (current weight is 161#) but even if you lose 10# that could help you - for panic attack try xanax 0.25mg  as needed (total 10 given, no refills). If this is helping you let me know  - I recommend seeing psychiatrist for panic attack as well but we can wait on this as discussed. You can think about this and let me know - If you wish to pursue more on shortness of breath then next step is a CPST bike test and/or referral to [facility]   > 50% of this > 25 min visit spent in face to face counseling

## 2011-12-23 NOTE — Assessment & Plan Note (Signed)
Idiopatthic nature discussed. Explained that chripractor maninpulation as etiology is described in literature. Prognosis is uncertain. Surgical options available but not routine explained. Best approach is to try to get fit, lose weight and see if this can alleviate dyspnea. She is agreeable to plan

## 2011-12-27 ENCOUNTER — Telehealth: Payer: Self-pay | Admitting: Internal Medicine

## 2011-12-27 NOTE — Telephone Encounter (Signed)
LMTCB

## 2011-12-28 NOTE — Telephone Encounter (Signed)
Spoke with Trish with [employer].  She received the Attending Physician Statement, and is requesting more specific information regarding abnormal findings.  She would like pt's FEV1 % predicted according to most recent PFT, respiratory rate, and o2 sat, and would like to know if these were monitored while pt was ambulating because, per Trish, "the condition that is listing the pt could have had this all of her life."  Spoke with Tammy D. Regarding this -- will forward message to MR - pls advise.  Thanks!

## 2011-12-28 NOTE — Telephone Encounter (Signed)
Best I talk to this person directly. I am in office 2/21, 2/22 9am - 12.30. Pls have them call me. Can interrupt if i I am in middle of patient

## 2011-12-28 NOTE — Telephone Encounter (Signed)
lmomtcb for [employer]

## 2011-12-29 ENCOUNTER — Encounter: Payer: Self-pay | Admitting: Internal Medicine

## 2011-12-29 NOTE — Telephone Encounter (Signed)
LMTCB- when she calls back need to transfer her to MR if he is still here.

## 2011-12-30 NOTE — Telephone Encounter (Signed)
Called, spoke with Trish.  She states she has already spoken with MR yesterday regarding this and nothing further is needed.

## 2012-01-03 ENCOUNTER — Encounter (HOSPITAL_COMMUNITY): Payer: Self-pay

## 2012-01-03 ENCOUNTER — Encounter (HOSPITAL_COMMUNITY)
Admission: RE | Admit: 2012-01-03 | Discharge: 2012-01-03 | Disposition: A | Discharge status: Home / Self Care | Payer: BC Managed Care – PPO | Source: Ambulatory Visit | Attending: Internal Medicine | Admitting: Internal Medicine

## 2012-01-03 DIAGNOSIS — R0989 Other specified symptoms and signs involving the circulatory and respiratory systems: Secondary | ICD-10-CM | POA: Insufficient documentation

## 2012-01-03 DIAGNOSIS — R490 Dysphonia: Secondary | ICD-10-CM | POA: Insufficient documentation

## 2012-01-03 DIAGNOSIS — K219 Gastro-esophageal reflux disease without esophagitis: Secondary | ICD-10-CM | POA: Insufficient documentation

## 2012-01-03 DIAGNOSIS — R0609 Other forms of dyspnea: Secondary | ICD-10-CM | POA: Insufficient documentation

## 2012-01-03 DIAGNOSIS — J986 Disorders of diaphragm: Secondary | ICD-10-CM | POA: Insufficient documentation

## 2012-01-03 HISTORY — DX: Hyperlipidemia, unspecified: E78.5

## 2012-01-03 NOTE — Progress Notes (Signed)
Virginia Richardson came today for orientation to Pulmonary Rehab.  Demonstration and practice of PLB using pulse oximeter.  Patient able to return demonstration satisfactorily. We also instructed, demonstrated and practiced diaphragmatic breathing.  Breath sounds diminished on left side midline to bases.  6 min walk test done, oxygen saturations stable 97 to 100 % on room air.  Rated shortness of breath at 1 on dyspnea scale.  She will begin exercise on 01-05-2012. Safety and hand hygiene in the exercise area reviewed with patient.  Patient voices understanding.

## 2012-01-05 ENCOUNTER — Encounter (HOSPITAL_COMMUNITY)
Admission: RE | Admit: 2012-01-05 | Discharge: 2012-01-05 | Disposition: A | Discharge status: Home / Self Care | Payer: BC Managed Care – PPO | Source: Ambulatory Visit | Attending: Internal Medicine | Admitting: Internal Medicine

## 2012-01-05 NOTE — Progress Notes (Signed)
First day of exercise in Pulmonary Rehab.  Virginia Richardson was oriented to equipment use, RPE and Dyspnea Scales, and rest breaks explained to patient.  Vital signs and oxygen levels were stable and she tolerated exercise well.

## 2012-01-09 ENCOUNTER — Telehealth: Payer: Self-pay | Admitting: Internal Medicine

## 2012-01-09 NOTE — Telephone Encounter (Signed)
Pt will like to return to work next week. Will she need an appt before you will release her? Please advise. [name], CMA

## 2012-01-09 NOTE — Telephone Encounter (Signed)
No she can go to work. No need to see me unless new problems. Otherwise, keep up appt per last OV plan

## 2012-01-10 ENCOUNTER — Encounter (HOSPITAL_COMMUNITY)
Admission: RE | Admit: 2012-01-10 | Discharge: 2012-01-10 | Disposition: A | Discharge status: Home / Self Care | Payer: BC Managed Care – PPO | Source: Ambulatory Visit | Attending: Internal Medicine | Admitting: Internal Medicine

## 2012-01-10 DIAGNOSIS — K219 Gastro-esophageal reflux disease without esophagitis: Secondary | ICD-10-CM | POA: Insufficient documentation

## 2012-01-10 DIAGNOSIS — J986 Disorders of diaphragm: Secondary | ICD-10-CM | POA: Insufficient documentation

## 2012-01-10 DIAGNOSIS — R490 Dysphonia: Secondary | ICD-10-CM | POA: Insufficient documentation

## 2012-01-10 DIAGNOSIS — R0989 Other specified symptoms and signs involving the circulatory and respiratory systems: Secondary | ICD-10-CM | POA: Insufficient documentation

## 2012-01-10 DIAGNOSIS — R0609 Other forms of dyspnea: Secondary | ICD-10-CM | POA: Insufficient documentation

## 2012-01-11 NOTE — Telephone Encounter (Signed)
[email or web address].Merritt Mccravy, CMA  

## 2012-01-12 ENCOUNTER — Encounter (HOSPITAL_COMMUNITY)
Admission: RE | Admit: 2012-01-12 | Discharge: 2012-01-12 | Disposition: A | Discharge status: Home / Self Care | Payer: BC Managed Care – PPO | Source: Ambulatory Visit | Attending: Internal Medicine | Admitting: Internal Medicine

## 2012-01-17 ENCOUNTER — Encounter (HOSPITAL_COMMUNITY)
Admission: RE | Admit: 2012-01-17 | Discharge: 2012-01-17 | Disposition: A | Discharge status: Home / Self Care | Payer: BC Managed Care – PPO | Source: Ambulatory Visit | Attending: Internal Medicine | Admitting: Internal Medicine

## 2012-01-17 NOTE — Progress Notes (Signed)
Pulmonary Rehab Nutrition Screen  Virginia Richardson 51 y.o. female             Ht: 64" Ht Readings from Last 1 Encounters:  12/23/11 5' 3.75" (1.619 m)    Wt:   162.4 lb (73.8 kg) Wt Readings from Last 3 Encounters:  12/23/11 163 lb (73.936 kg)  11/23/11 162 lb (73.483 kg)  11/16/11 159 lb 6.4 oz (72.303 kg)    BMI: 27.9  34.6%body fat                       Rate Your Plate Score: 41  Please answer the following questions:             YES  NO    Do you live in a nursing home?  X   Do you eat out more than 3 times per week?    X If yes, how many times per week do you eat out? 3-5  Do you have food allergies?   X If yes, what are you allergic to?  Have you gained or lost more than 10 lbs without trying?              X  If yes, how much weight have you  gained? 12 lbs over 6 mo   Do you want to lose weight?    X  If yes, what is a goal weight or amount of weight you would like to lose? 15-20 lbs  Do you eat alone most of the time?  X    Do you eat less than 2 meals/day?  X If yes, how many meals do you eat?  Do you use canned and convenience food? X  Trying to use fresh/frozen vegetables more  Do you use a salt shaker? X    Do you drink more than 3 alcoholic drinks/day?  X If yes, how many drinks per day?  Are you having trouble with constipation? *  X If yes, what are you doing to help relieve constipation?  Do you have financial difficulties with buying food? *  X   Do you usually need help with grocery shopping or with cooking? *  X   Do you have a poor appetite? *                                       X   Do you have trouble chewing/ swallowing? *   X   Do you take vitamin and mineral or herbal supplements? * X  If yes, what kind of supplements do you currently take? Fish oil, Valerian Root    Past Medical History  Diagnosis Date  . Melanoma     right calf 2008  . Hyperlipidemia    Labs Lipid Panel  No results found for this basename: chol, trig, hdl, cholhdl, vldl, ldlcalc    No results found for this basename: HGBA1C   Nutrition Risk Level:  Low   Virginia Richardson 51 y.o. female Nutrition Note Spoke with pt. Pt is overweight.  Pt reports she gained 12 lbs over the past 6 months due to decreased activity and her mother's illness/eating hospital food. Pt interested in wt loss. Pt eats 3 meals a day; most prepared at home. Pt reports barriers to carrying lunch to work are "a 3/4 mile walk and search of her bags." Barriers discussed and pt stated "  I need to allow time before I have to be at work so I can eat healthier." There are some ways the pt can make her eating habits healthier.  Pt's Rate Your Plate results reviewed with pt.  Pt expressed understanding.  Pt has recently started to avoid salty food by "trying to choose fresh/frozen vegetables." Pt continues to use canned/ convenience food some.  Pt adds salt to food "ocassionally."  The role of sodium in lung disease reviewed with pt.   Nutrition Diagnosis   Excessive sodium intake related to over consumption of processed food as evidenced by frequent consumption of convenience food/ canned vegetables.   Food-and nutrition-related knowledge deficit related to lack of exposure to information as related to diagnosis of pulmonary disease   Overweight related to excessive energy intake as evidenced by a BMI of 27.9 Nutrition Rx/Est. Daily Nutrition Needs for: ? wt loss 1200-1700 Kcal  60-70 gm protein   1500-2000 mg or less sodium      Nutrition Intervention   Pt's individual nutrition plan and goals reviewed with pt.   Benefits of adopting healthy eating habits discussed when pt's Rate Your Plate reviewed.   Pt to attend the Nutrition and Lung Disease class   Continual client-centered nutrition education by RD, as part of interdisciplinary care. Goal(s) 1. Pt to identify and limit food sources of sodium. 2. Identify food quantities necessary to achieve wt loss of  -2# per week to a goal wt of 62.7-70.9 kg (138-156  lb) at graduation from pulmonary rehab. 3. Describe the benefit of including fruits, vegetables, whole grains, and low-fat dairy products in a healthy meal plan. Monitor and Evaluate progress toward nutrition goal with team.

## 2012-01-17 NOTE — Telephone Encounter (Signed)
LMOM for pt TCBx2 

## 2012-01-17 NOTE — Telephone Encounter (Signed)
I spoke with the pt and advised of mr recs. She will bring the form by for him to sign. I advised I will have him sign it tomorrow. .sing

## 2012-01-17 NOTE — Telephone Encounter (Signed)
Pt returned call

## 2012-01-19 ENCOUNTER — Encounter (HOSPITAL_COMMUNITY)
Admission: RE | Admit: 2012-01-19 | Discharge: 2012-01-19 | Disposition: A | Discharge status: Home / Self Care | Payer: BC Managed Care – PPO | Source: Ambulatory Visit | Attending: Internal Medicine | Admitting: Internal Medicine

## 2012-01-24 ENCOUNTER — Encounter (HOSPITAL_COMMUNITY)
Admission: RE | Admit: 2012-01-24 | Discharge: 2012-01-24 | Disposition: A | Discharge status: Home / Self Care | Payer: BC Managed Care – PPO | Source: Ambulatory Visit | Attending: Internal Medicine | Admitting: Internal Medicine

## 2012-01-26 ENCOUNTER — Encounter (HOSPITAL_COMMUNITY)
Admission: RE | Admit: 2012-01-26 | Discharge: 2012-01-26 | Disposition: A | Discharge status: Home / Self Care | Payer: BC Managed Care – PPO | Source: Ambulatory Visit | Attending: Internal Medicine | Admitting: Internal Medicine

## 2012-01-26 NOTE — Progress Notes (Signed)
Patient requested to do the elliptical. Patient instructed on how to use. Patient demonstrated pursed lip breathing. Patient vital signs stable. Patient completed 5 minutes on elliptical with no complaints. Patient instructed to take time with elliptical and work herself to be able to be on it for more than 5 minutes.  Patient voiced understanding. Will continue to follow patient while she is on elliptical.

## 2012-01-27 ENCOUNTER — Telehealth: Payer: Self-pay | Admitting: Internal Medicine

## 2012-01-27 DIAGNOSIS — R0600 Dyspnea, unspecified: Secondary | ICD-10-CM

## 2012-01-27 NOTE — Telephone Encounter (Signed)
ATC line rang busy x 2, WCB

## 2012-01-30 NOTE — Telephone Encounter (Signed)
Pt states she is still having some sob and has decided she would like to go ahead with CPST. I will forward to MR so he is aware and may order if he still thinks this is necessary.

## 2012-01-30 NOTE — Telephone Encounter (Signed)
CPST ordered and pt aware PCC's will call with the appt date and time.

## 2012-01-30 NOTE — Telephone Encounter (Signed)
Yes, please order CPST to be done on bike by Mr Virginia Richardson. No need for abg. Needs EIB study post CPST

## 2012-01-31 ENCOUNTER — Encounter (HOSPITAL_COMMUNITY): Payer: BC Managed Care – PPO

## 2012-02-01 ENCOUNTER — Telehealth: Payer: Self-pay | Admitting: Internal Medicine

## 2012-02-01 NOTE — Telephone Encounter (Signed)
i got a note from [employer] claims unit through [employer] dated 01/25/12 asking me to fill a form about return to work.   A) does she need me to fil this? I thought I filled out something similar 2-3 weeks ago at rehab. Please clarify with her  B) if she wants me to do this, then best after CPST. Please find out when she is doing it and then arrange fu  Thanks MR

## 2012-02-02 ENCOUNTER — Encounter (HOSPITAL_COMMUNITY): Payer: BC Managed Care – PPO

## 2012-02-02 NOTE — Telephone Encounter (Signed)
I spoke with the pt and advised that we will complete form after CPST. [name], CMA

## 2012-02-07 ENCOUNTER — Ambulatory Visit (HOSPITAL_COMMUNITY): Payer: BC Managed Care – PPO | Attending: Internal Medicine

## 2012-02-07 ENCOUNTER — Encounter (HOSPITAL_COMMUNITY)
Admission: RE | Admit: 2012-02-07 | Discharge: 2012-02-07 | Disposition: A | Discharge status: Home / Self Care | Payer: BC Managed Care – PPO | Source: Ambulatory Visit | Attending: Internal Medicine | Admitting: Internal Medicine

## 2012-02-07 DIAGNOSIS — R0609 Other forms of dyspnea: Secondary | ICD-10-CM | POA: Insufficient documentation

## 2012-02-07 DIAGNOSIS — K219 Gastro-esophageal reflux disease without esophagitis: Secondary | ICD-10-CM | POA: Insufficient documentation

## 2012-02-07 DIAGNOSIS — R0989 Other specified symptoms and signs involving the circulatory and respiratory systems: Secondary | ICD-10-CM | POA: Insufficient documentation

## 2012-02-07 DIAGNOSIS — J986 Disorders of diaphragm: Secondary | ICD-10-CM | POA: Insufficient documentation

## 2012-02-07 DIAGNOSIS — R490 Dysphonia: Secondary | ICD-10-CM | POA: Insufficient documentation

## 2012-02-07 DIAGNOSIS — R0600 Dyspnea, unspecified: Secondary | ICD-10-CM

## 2012-02-09 ENCOUNTER — Encounter (HOSPITAL_COMMUNITY)
Admission: RE | Admit: 2012-02-09 | Discharge: 2012-02-09 | Disposition: A | Discharge status: Home / Self Care | Payer: BC Managed Care – PPO | Source: Ambulatory Visit | Attending: Internal Medicine | Admitting: Internal Medicine

## 2012-02-09 DIAGNOSIS — R0602 Shortness of breath: Secondary | ICD-10-CM

## 2012-02-11 ENCOUNTER — Emergency Department (HOSPITAL_COMMUNITY)
Admission: EM | Admit: 2012-02-11 | Discharge: 2012-02-11 | Disposition: A | Discharge status: Home / Self Care | Payer: BC Managed Care – PPO | Attending: Emergency Medicine | Admitting: Emergency Medicine

## 2012-02-11 ENCOUNTER — Encounter (HOSPITAL_COMMUNITY): Payer: Self-pay | Admitting: *Deleted

## 2012-02-11 DIAGNOSIS — K5289 Other specified noninfective gastroenteritis and colitis: Secondary | ICD-10-CM | POA: Insufficient documentation

## 2012-02-11 DIAGNOSIS — K529 Noninfective gastroenteritis and colitis, unspecified: Secondary | ICD-10-CM

## 2012-02-11 DIAGNOSIS — H9209 Otalgia, unspecified ear: Secondary | ICD-10-CM | POA: Insufficient documentation

## 2012-02-11 DIAGNOSIS — R6883 Chills (without fever): Secondary | ICD-10-CM | POA: Insufficient documentation

## 2012-02-11 DIAGNOSIS — R21 Rash and other nonspecific skin eruption: Secondary | ICD-10-CM | POA: Insufficient documentation

## 2012-02-11 LAB — DIFFERENTIAL
Basophils Relative: 0 % (ref 0–1)
Eosinophils Absolute: 0.1 10*3/uL (ref 0.0–0.7)
Eosinophils Relative: 1 % (ref 0–5)
Monocytes Absolute: 0.5 10*3/uL (ref 0.1–1.0)
Monocytes Relative: 4 % (ref 3–12)

## 2012-02-11 LAB — CBC
HCT: 43 % (ref 36.0–46.0)
Hemoglobin: 14.8 g/dL (ref 12.0–15.0)
MCH: 31.1 pg (ref 26.0–34.0)
MCHC: 34.4 g/dL (ref 30.0–36.0)
MCV: 90.3 fL (ref 78.0–100.0)
RDW: 14.2 % (ref 11.5–15.5)

## 2012-02-11 LAB — BASIC METABOLIC PANEL
BUN: 15 mg/dL (ref 6–23)
Calcium: 9 mg/dL (ref 8.4–10.5)
Chloride: 105 mEq/L (ref 96–112)
Creatinine, Ser: 0.86 mg/dL (ref 0.50–1.10)
GFR calc Af Amer: 90 mL/min — ABNORMAL LOW (ref >90)
GFR calc non Af Amer: 77 mL/min — ABNORMAL LOW (ref >90)

## 2012-02-11 LAB — URINALYSIS, ROUTINE W REFLEX MICROSCOPIC
Bilirubin Urine: NEGATIVE
Ketones, ur: 15 mg/dL — AB
Nitrite: NEGATIVE
Protein, ur: NEGATIVE mg/dL
pH: 6.5 (ref 5.0–8.0)

## 2012-02-11 MED ORDER — SODIUM CHLORIDE 0.9 % IV BOLUS (SEPSIS)
2000.0000 mL | Freq: Once | INTRAVENOUS | Status: AC
  Administered 2012-02-11: 2000 mL via INTRAVENOUS

## 2012-02-11 MED ORDER — ONDANSETRON HCL 4 MG/2ML IJ SOLN
4.0000 mg | Freq: Once | INTRAMUSCULAR | Status: AC
  Administered 2012-02-11: 4 mg via INTRAVENOUS
  Filled 2012-02-11: qty 2

## 2012-02-11 MED ORDER — ONDANSETRON HCL 4 MG PO TABS: 4.0000 mg | ORAL_TABLET | Freq: Four times a day (QID) | ORAL | Status: AC

## 2012-02-11 MED ORDER — DIPHENOXYLATE-ATROPINE 2.5-0.025 MG PO TABS: 1.0000 | ORAL_TABLET | Freq: Once | ORAL | Status: DC

## 2012-02-11 NOTE — ED Notes (Signed)
CDU unable to take at this time.

## 2012-02-11 NOTE — ED Notes (Signed)
Report received from Angelene Giovanni, RN - pt to be transported to CDU when room available

## 2012-02-11 NOTE — Discharge Instructions (Signed)
B.R.A.T. DIET FOR THE NEXT 6-12 HOURS THEN ADVANCE AS TOLERATED. ZOFRAN FOR NAUSEA AS NEEDED, AND RECOMMEND IMMODIUM IF HAVING MORE THAN 5 BOWEL MOVEMENTS DAILY. PUSH FLUIDS IN SMALL AMOUNTS. RETURN HERE WITH ANY HIGH FEVER, SEVERE PAIN, BLOODY VOMITING OR DIARRHEA, OR OTHER NEW CONCERN.  B.R.A.T. Diet Your doctor has recommended the B.R.A.T. diet for you or your child until the condition improves. This is often used to help control diarrhea and vomiting symptoms. If you or your child can tolerate clear liquids, you may have:  Bananas.   Rice.   Applesauce.   Toast (and other simple starches such as crackers, potatoes, noodles).  Be sure to avoid dairy products, meats, and fatty foods until symptoms are better. Fruit juices such as apple, grape, and prune juice can make diarrhea worse. Avoid these. Continue this diet for 2 days or as instructed by your caregiver. Document Released: 10/24/2005 Document Revised: 10/13/2011 Document Reviewed: 04/12/2007 [facility] Patient Information 2012 [place], [place].  Diet for Diarrhea, Adult Having frequent, runny stools (diarrhea) has many causes. Diarrhea may be caused or worsened by food or drink. Diarrhea may be relieved by changing your diet. IF YOU ARE NOT TOLERATING SOLID FOODS:  Drink enough water and fluids to keep your urine clear or pale yellow.   Avoid sugary drinks and sodas as well as milk-based beverages.   Avoid beverages containing caffeine and alcohol.   You may try rehydrating beverages. You can make your own by following this recipe:    tsp table salt.    tsp baking soda.   ? tsp salt substitute (potassium chloride).   1 tbs + 1 tsp sugar.   1 qt water.  As your stools become more solid, you can start eating solid foods. Add foods one at a time. If a certain food causes your diarrhea to get worse, avoid that food and try other foods. A low fiber, low-fat, and lactose-free diet is recommended. Small, frequent meals may be  better tolerated.  Starches  Allowed:  White, [place], and pita breads, plain rolls, buns, bagels. Plain muffins, matzo. Soda, saltine, or graham crackers. Pretzels, melba toast, zwieback. Cooked cereals made with water: cornmeal, farina, cream cereals. Dry cereals: refined corn, wheat, rice. Potatoes prepared any way without skins, refined macaroni, spaghetti, noodles, refined rice.   Avoid:  Bread, rolls, or crackers made with whole wheat, multi-grains, rye, bran seeds, nuts, or coconut. Corn tortillas or taco shells. Cereals containing whole grains, multi-grains, bran, coconut, nuts, or raisins. Cooked or dry oatmeal. Coarse wheat cereals, granola. Cereals advertised as "high-fiber." Potato skins. Whole grain pasta, wild or brown rice. Popcorn. Sweet potatoes/yams. Sweet rolls, doughnuts, waffles, pancakes, sweet breads.  Vegetables  Allowed: Strained tomato and vegetable juices. Most well-cooked and canned vegetables without seeds. Fresh: Tender lettuce, cucumber without the skin, cabbage, spinach, bean sprouts.   Avoid: Fresh, cooked, or canned: Artichokes, baked beans, beet greens, broccoli, Brussels sprouts, corn, kale, legumes, peas, sweet potatoes. Cooked: Green or red cabbage, spinach. Avoid large servings of any vegetables, because vegetables shrink when cooked, and they contain more fiber per serving than fresh vegetables.  Fruit  Allowed: All fruit juices except prune juice. Cooked or canned: Apricots, applesauce, cantaloupe, cherries, fruit cocktail, grapefruit, grapes, kiwi, mandarin oranges, peaches, pears, plums, watermelon. Fresh: Apples without skin, ripe banana, grapes, cantaloupe, cherries, grapefruit, peaches, oranges, plums. Keep servings limited to  cup or 1 piece.   Avoid: Fresh: Apple with skin, apricots, mango, pears, raspberries, strawberries. Prune juice, stewed or  dried prunes. Dried fruits, raisins, dates. Large servings of all fresh fruits.  Meat and Meat  Substitutes  Allowed: Ground or well-cooked tender beef, ham, veal, lamb, pork, or poultry. Eggs, plain cheese. Fish, oysters, shrimp, lobster, other seafoods. Liver, organ meats.   Avoid: Tough, fibrous meats with gristle. Peanut butter, smooth or chunky. Cheese, nuts, seeds, legumes, dried peas, beans, lentils.  Milk  Allowed: Yogurt, lactose-free milk, kefir, drinkable yogurt, buttermilk, soy milk.   Avoid: Milk, chocolate milk, beverages made with milk, such as milk shakes.  Soups  Allowed: Bouillon, broth, or soups made from allowed foods. Any strained soup.   Avoid: Soups made from vegetables that are not allowed, cream or milk-based soups.  Desserts and Sweets  Allowed: Sugar-free gelatin, sugar-free frozen ice pops made without sugar alcohol.   Avoid: Plain cakes and cookies, pie made with allowed fruit, pudding, custard, cream pie. Gelatin, fruit, ice, sherbet, frozen ice pops. Ice cream, ice milk without nuts. Plain hard candy, honey, jelly, molasses, syrup, sugar, chocolate syrup, gumdrops, marshmallows.  Fats and Oils  Allowed: Avoid any fats and oils.   Avoid: Seeds, nuts, olives, avocados. Margarine, butter, cream, mayonnaise, salad oils, plain salad dressings made from allowed foods. Plain gravy, crisp bacon without rind.  Beverages  Allowed: Water, decaffeinated teas, oral rehydration solutions, sugar-free beverages.   Avoid: Fruit juices, caffeinated beverages (coffee, tea, soda or pop), alcohol, sports drinks, or lemon-lime soda or pop.  Condiments  Allowed: Ketchup, mustard, horseradish, vinegar, cream sauce, cheese sauce, cocoa powder. Spices in moderation: allspice, basil, bay leaves, celery powder or leaves, cinnamon, cumin powder, curry powder, ginger, mace, marjoram, onion or garlic powder, oregano, paprika, parsley flakes, ground pepper, rosemary, sage, savory, tarragon, thyme, turmeric.   Avoid: Coconut, honey.  Weight Monitoring: Weigh yourself every day.  You should weigh yourself in the morning after you urinate and before you eat breakfast. Wear the same amount of clothing when you weigh yourself. Record your weight daily. Bring your recorded weights to your clinic visits. Tell your caregiver right away if you have gained 3 lb/1.4 kg or more in 1 day, 5 lb/2.3 kg in a week, or whatever amount you were told to report. SEEK IMMEDIATE MEDICAL CARE IF:   You are unable to keep fluids down.   You start to throw up (vomit) or diarrhea keeps coming back (persistent).   Abdominal pain develops, increases, or can be felt in one place (localizes).   You have an oral temperature above 102 F (38.9 C), not controlled by medicine.   Diarrhea contains blood or mucus.   You develop excessive weakness, dizziness, fainting, or extreme thirst.  MAKE SURE YOU:   Understand these instructions.   Will watch your condition.   Will get help right away if you are not doing well or get worse.  Document Released: 01/14/2004 Document Revised: 10/13/2011 Document Reviewed: 05/07/2009 [facility] Patient Information 2012 [place], [place].

## 2012-02-11 NOTE — ED Notes (Signed)
Lab called with positive for C-Diff

## 2012-02-11 NOTE — ED Notes (Signed)
Pt reporting n/v/d since last night at 1030 pm. Pt reports last vomiting episode this morning upon arrival to ED. Pt has rash to face reporting noticed this morning.

## 2012-02-11 NOTE — ED Provider Notes (Signed)
No further vomiting. She has had one watery bowel movement. Concerned about facial rash and redness to chest. Skin appears blotchy but no petechiae, blisters or whelts. Will continue to monitor but patient willing to try PO challenge now and reports feeling better. Anticipate discharge soon.  14:50 - No further vomiting. She has been up and walking without dizziness. One further bowel movement only. She is tolerating PO fluids and her and husband feel safe being discharged home.  Rodena Medin, PA-C 02/11/12 1455

## 2012-02-11 NOTE — ED Provider Notes (Signed)
History     CSN: [id number]  Arrival date & time 02/11/12  4132   First MD Initiated Contact with Patient 02/11/12 1027      Chief Complaint  Patient presents with  . Emesis    (Consider location/radiation/quality/duration/timing/severity/associated sxs/prior treatment) Patient is a 51 y.o. female presenting with vomiting. The history is provided by the patient and a significant other. No language interpreter was used.  Emesis  This is a new problem. The current episode started yesterday. The problem occurs more than 10 times per day. The problem has been gradually worsening. The emesis has an appearance of stomach contents. There has been no fever. Associated symptoms include abdominal pain, chills and diarrhea. Pertinent negatives include no cough, no fever, no headaches, no sweats and no URI.   Patient here with complaint of diarrhea and vomiting x16 hours. States that she has had more than 10 episodes of diarrhea and has vomited 12-14 pounds since 10:30 last night. She also has a rash to her face it is nonraised and does not patch. States that she had a similar rash 3 weeks ago but it went away. 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 States that she did have chills last night but has no fever. States that she was on antibiotic Z-Pak in December but nothing since then. Past Medical History  Diagnosis Date  . Melanoma     right calf 2008  . Hyperlipidemia     Past Surgical History  Procedure Date  . Appendectomy   . Carpal tunnel release 2003, 2009    Family History  Problem Relation Age of Onset  . COPD Mother   . Allergies Mother   . Asthma Mother   . Heart disease Mother   . COPD Father   . Asthma Father   . Heart disease Father   . Heart disease Maternal Uncle     History  Substance Use Topics  . Smoking status: Former Smoker -- 1.0  packs/day for 8 years    Types: Cigarettes    Quit date: 11/07/1982  . Smokeless tobacco: Never Used  . Alcohol Use: No    OB History    Grav Para Term Preterm Abortions TAB SAB Ect Mult Living                  Review of Systems  Constitutional: Positive for chills. Negative for fever.  HENT: Positive for ear pain. Negative for sore throat, facial swelling, rhinorrhea, neck pain, neck stiffness, voice change, sinus pressure and ear discharge.   Eyes: Negative.   Respiratory: Negative.  Negative for cough.   Cardiovascular: Negative.   Gastrointestinal: Positive for nausea, vomiting, abdominal pain and diarrhea. Negative for constipation, blood in stool and rectal pain.  Genitourinary: Negative for dysuria, hematuria, vaginal discharge and pelvic pain.  Skin: Positive for rash.  Neurological: Negative.  Negative for dizziness and headaches.  Psychiatric/Behavioral: Negative.   All other systems reviewed and are negative.    Allergies  Review of patient's allergies indicates no known allergies.  Home Medications   Current Outpatient Rx  Name Route Sig Dispense Refill  . ESTRADIOL 0.05 MG/24HR TD PTTW Transdermal Place 1 patch onto the skin 2 (two) times a week. Wednesday and Saturday.    . OMEGA-3 FATTY ACIDS 1000 MG PO CAPS Oral Take 2 g by mouth daily.    . IBUPROFEN  200 MG PO TABS Oral Take 400 mg by mouth every 6 (six) hours as needed. pain    . NON FORMULARY Oral Take 20 mg by mouth daily. Vibryd    . PROGESTERONE MICRONIZED 100 MG PO CAPS Oral Take 1 tablet by mouth At bedtime.      BP 120/80  Pulse 101  Temp(Src) 98.9 F (37.2 C) (Oral)  Resp 20  SpO2 98%  Physical Exam  Nursing note and vitals reviewed. Constitutional: She is oriented to person, place, and time. She appears well-developed and well-nourished.  HENT:  Head: Normocephalic and atraumatic.  Eyes: Conjunctivae and EOM are normal. Pupils are equal, round, and reactive to light.  Neck: Normal  range of motion. Neck supple.  Cardiovascular: Normal rate, regular rhythm, normal heart sounds and intact distal pulses.  Exam reveals no gallop and no friction rub.   No murmur heard. Pulmonary/Chest: Effort normal and breath sounds normal.  Abdominal: Soft. Bowel sounds are normal. She exhibits no distension. There is no tenderness. There is no rebound and no guarding.  Musculoskeletal: Normal range of motion. She exhibits no edema and no tenderness.  Neurological: She is alert and oriented to person, place, and time. She has normal reflexes.  Skin: Skin is warm and dry.  Psychiatric: She has a normal mood and affect.    ED Course  Procedures (including critical care time)   Labs Reviewed  CBC  DIFFERENTIAL  BASIC METABOLIC PANEL  URINALYSIS, ROUTINE W REFLEX MICROSCOPIC   No results found.   No diagnosis found.    MDM  1300 Report given to Sherri in CDU patient is on dehydration protocol.  Will hydrate with ns, give zofran and lomotil.  Get c-diff specimen if possible.  Follow up with pcp.  rx for zofran and lomotil.        Jethro Bastos, NP 02/11/12 1515

## 2012-02-11 NOTE — ED Notes (Signed)
To ed for eval of n/v/d since 10 pm last night. Also with c/o rash to face past vomiting.

## 2012-02-11 NOTE — ED Notes (Signed)
2nd liter bolus started 

## 2012-02-12 NOTE — ED Provider Notes (Signed)
Medical screening examination/treatment/procedure(s) were performed by non-physician practitioner and as supervising physician I was immediately available for consultation/collaboration.   Murel Wigle L Charmayne Odell, MD 02/12/12 0737 

## 2012-02-12 NOTE — ED Provider Notes (Signed)
Medical screening examination/treatment/procedure(s) were performed by non-physician practitioner and as supervising physician I was immediately available for consultation/collaboration.   Benny Lennert, MD 02/12/12 [phone]

## 2012-02-13 NOTE — ED Notes (Signed)
If necessary, Rx for Flagyl 500mg   QID

## 2012-02-14 ENCOUNTER — Encounter (HOSPITAL_COMMUNITY): Admission: RE | Admit: 2012-02-14 | Payer: BC Managed Care – PPO | Source: Ambulatory Visit

## 2012-02-15 NOTE — ED Notes (Addendum)
Patient informed of results. She stated that diarrhea had resolved and she felt better.Chart appended.

## 2012-02-16 ENCOUNTER — Telehealth: Payer: Self-pay | Admitting: Internal Medicine

## 2012-02-16 ENCOUNTER — Encounter (HOSPITAL_COMMUNITY): Payer: BC Managed Care – PPO

## 2012-02-16 NOTE — Telephone Encounter (Signed)
Results given to husband. Will see her for fu 4/222/13 to discuss further. Might consider asthma mdi. Encournter closed

## 2012-02-16 NOTE — Telephone Encounter (Signed)
Called and spoke with pts husband ronnie.  Pt is requesting the results of the stress test that she had done.  MR  Please advise.  thanks

## 2012-02-21 ENCOUNTER — Encounter (HOSPITAL_COMMUNITY): Payer: BC Managed Care – PPO

## 2012-02-23 ENCOUNTER — Encounter (HOSPITAL_COMMUNITY): Payer: BC Managed Care – PPO

## 2012-02-27 ENCOUNTER — Ambulatory Visit: Payer: BC Managed Care – PPO | Admitting: Internal Medicine

## 2012-02-28 ENCOUNTER — Encounter (HOSPITAL_COMMUNITY): Payer: BC Managed Care – PPO

## 2012-03-01 ENCOUNTER — Encounter (HOSPITAL_COMMUNITY): Payer: BC Managed Care – PPO

## 2012-03-06 ENCOUNTER — Encounter (HOSPITAL_COMMUNITY)
Admission: RE | Admit: 2012-03-06 | Discharge: 2012-03-06 | Disposition: A | Discharge status: Home / Self Care | Payer: BC Managed Care – PPO | Source: Ambulatory Visit | Attending: Internal Medicine | Admitting: Internal Medicine

## 2012-03-06 NOTE — Progress Notes (Signed)
Patient returns today to Pulmonary rehab after absence due to GI upset and then because she was working for [employer].  Today we discussed concentrating her exercise activities to upper body and upper body stretches and also ask her to concentrate more on diaphragmatic breathing which she has been doing minimally.  We reviewed this technique together.  She does not feel at this time that she has made much improvement in the area of her diaphragm. Exercise has helped stamina.  We will continue support and encouragement.

## 2012-03-08 ENCOUNTER — Encounter (HOSPITAL_COMMUNITY)
Admission: RE | Admit: 2012-03-08 | Discharge: 2012-03-08 | Disposition: A | Discharge status: Home / Self Care | Payer: BC Managed Care – PPO | Source: Ambulatory Visit | Attending: Internal Medicine | Admitting: Internal Medicine

## 2012-03-08 DIAGNOSIS — J986 Disorders of diaphragm: Secondary | ICD-10-CM | POA: Insufficient documentation

## 2012-03-08 DIAGNOSIS — K219 Gastro-esophageal reflux disease without esophagitis: Secondary | ICD-10-CM | POA: Insufficient documentation

## 2012-03-08 DIAGNOSIS — R490 Dysphonia: Secondary | ICD-10-CM | POA: Insufficient documentation

## 2012-03-08 DIAGNOSIS — R0609 Other forms of dyspnea: Secondary | ICD-10-CM | POA: Insufficient documentation

## 2012-03-08 DIAGNOSIS — R0989 Other specified symptoms and signs involving the circulatory and respiratory systems: Secondary | ICD-10-CM | POA: Insufficient documentation

## 2012-03-13 ENCOUNTER — Encounter (HOSPITAL_COMMUNITY)
Admission: RE | Admit: 2012-03-13 | Discharge: 2012-03-13 | Disposition: A | Discharge status: Home / Self Care | Payer: BC Managed Care – PPO | Source: Ambulatory Visit | Attending: Internal Medicine | Admitting: Internal Medicine

## 2012-03-15 ENCOUNTER — Encounter (HOSPITAL_COMMUNITY): Payer: BC Managed Care – PPO

## 2012-03-20 ENCOUNTER — Encounter (HOSPITAL_COMMUNITY): Payer: BC Managed Care – PPO

## 2012-03-22 ENCOUNTER — Ambulatory Visit (INDEPENDENT_AMBULATORY_CARE_PROVIDER_SITE_OTHER): Payer: BC Managed Care – PPO | Admitting: Internal Medicine

## 2012-03-22 ENCOUNTER — Encounter: Payer: Self-pay | Admitting: Internal Medicine

## 2012-03-22 ENCOUNTER — Ambulatory Visit: Payer: BC Managed Care – PPO | Admitting: Internal Medicine

## 2012-03-22 ENCOUNTER — Encounter (HOSPITAL_COMMUNITY): Payer: BC Managed Care – PPO

## 2012-03-22 VITALS — BP 102/80 | HR 73 | Temp 97.8°F | Ht 64.0 in | Wt 162.2 lb

## 2012-03-22 DIAGNOSIS — R0600 Dyspnea, unspecified: Secondary | ICD-10-CM

## 2012-03-22 DIAGNOSIS — R0602 Shortness of breath: Secondary | ICD-10-CM

## 2012-03-22 DIAGNOSIS — R0989 Other specified symptoms and signs involving the circulatory and respiratory systems: Secondary | ICD-10-CM

## 2012-03-22 NOTE — Progress Notes (Signed)
Subjective:    Patient ID: Virginia Richardson, female    DOB: 1961/04/09, 52 y.o.   MRN: [id number]  HPI OV 11/16/2011 51 year old female. Works in [employer]. No medical issues. Remote smoker - quit 1984 after 8ppd smoking hx.  Reports multiple visits to [facility] to attend to sick mom (UTIs).   Reports dyspnea x 1 month but has gained weight due to hospital food as visitor. Due to mom'sillness did not have time to seek medical attention. Insidious onset. 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 Improved with rest. Stable since onset. Denies associated cough, wheezing, sputum, orthopnea, paroxysmal nocturnal dyspnea, chest pain. However, past few days notices some discomfort in Left infra-axillary region when she exerts or some tightness when she inhales deeply. Also associated hoarseness since 1 month  Mom got out of [facility] on 11/11/11 after opd procedure with supra public foley. Then on 11/12/11 Saturday felt she might be getting acutely ill. Then next day 11/13/11 Sunday nausea, unable to eat, vomiting that persisted on 11/14/11. Yesterday 11/15/11 spent time taking bed rest but noticed after cleaning up old stew off refrigerator. After walking 150feeet for that felt that head was going to split with headache and had nausea. Says last several days not eaten much. Eating only crackers. Taking anti-nausea meds and is helping. Today only had mash potatoes. Of note no fever per hx since Monday 11/14/11  Saw NP Lauretta Chester yesterday and CXR 11/25/11 at DR Kim's office shows new LEFT DIAPHRAGM ELEVATION compared to feb 2012 which was normal at that time (does not remember why cxr was done 1 year ago). CBC was normal. BMET normal. DC on Zpak with referral here.   Social: 51 year old mom. 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  Family: 2 catts. No kids. Spouse + who is non-smoker    OV 11/23/2011  FU dyspnea: still dyspneic. Stil with nausea esp with exposure to certain  foods. No vomiting.  Saw Dr Pollyann Kennedy ENT and per her hx: ENT eval was normal (note not available). Now recollects: that on 10/15/11 tripped and fell missing stairs fell on left side outstretched hand. Initially left ankle sprain and saw Dr Selena Batten 10/17/11 for left ankle sprain (xray negative per hx). Then around 10/24/11 woke up one morning and noticed left shoulder ache - sudden onset when she woke up. Underwent massage x 2 and chiropractor x 2 ( around xmas time to neck and back; felt crack noise). Pain still present but has moved to left supra-mammary area and husband feels this area is more pronounced. Dyspnea noticed after the fall but husband says intermittent nausea mild on and off several months.   REC I think you need MRI of the spine - I will talk to radiologist today and then order it  I will touch base with Dr Selena Batten about yoru nausea and Dr Pollyann Kennedy  My nurse will get Dr Lucky Rathke note  Stay off work till workup is complete  Followup based on MRI  Address insomnia at followup  OV 12/23/2011  Fu for left diphragm weakness/paralysis  with dyspnea out of propprtion. So has had more workup and the are the following  Additional Hx: chiropractor last yr and they were 7/23, 7/24, 10/12, 12/18, and 12/19. - hx of neck mainipulation. Also 15# weight gain iin past 2 months  ENT workup: note of Dr Pollyann Kennedy reviewed. No vocal cord paralysisi  ABG    Component Value  Date/Time   PHART 7.407* 11/30/2011 1315   PCO2ART 38.8 11/30/2011 1315   PO2ART 73.2* 11/30/2011 1315   HCO3 24.0 11/30/2011 1315   TCO2 21.2 11/30/2011 1315   ACIDBASEDEF 0.0 11/30/2011 1315   O2SAT 94.4 11/30/2011 1315   Jan/FEb 2013: Saw Dr. Nadara Eaton  And cardiac myoview stress test negative   12/13/11: PFts reviewd. 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   walk test did not show drop in oxygen   Here to discuss all of above. Of note, she nearly passed out afer ABG and went to ER and then referred to cardiology. Still having  exertional dyspnea for carrying cat, doing yard work, pushing grocery cart up incline. In addition, 3 attacks of self described panic attacks - sudden, random, when by herself resting, air hunger and feeling of impending doom and spontaneously settles. No associated tingling.   Also reporting significant GERD since illness srarted. PPI not helping. Taking TUMS. Diet is not great: drinks coffee, tea, and eats fried food.   Husband very concerned  #shortness of breath  - this is due to left diaphragm weakness/paralysis and likely panic attack as well  - I recommend you start pulmonary rehab - nurse will make referral  - I would recommend some weight loss; goal weight is 141# (current weight is 161#) but even if you lose 10# that could help you  - for panic attack try xanax 0.25mg  as needed (total 10 given, no refills). If this is helping you let me know  - I recommend seeing psychiatrist for panic attack as well but we can wait on this as discussed. 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 Based on that we decided to consider asthma Rx v rehab and opted for rehab and weight loss. She did attend rehab a bit but then discontinued. She returned to work due to $ issues. She never lost weight but seemed to be doing okay but recently past several weeks having dyspnea again. Reporting 2 types: one spontaneous random episodes that does not have all elements of panic but is closest to panic. She did have relief with xanax in past but this time did not take them acutely.  Though she took it the following day as preventative. In addition, after lifiting very heavy boxes and doing yard work got very dyspneic. She and husband are very frustrated by symptoms and are expressing willingness to see DR Harlen Labs at [facility]     CPST 4/.4/13: Conclusion:   Resting spirometry demonstrates a mild restrictive pattern consistent with her previous PFT evaluation. The MVV is normal. Post-exercise spirometry was performed IPE, 5, 10 (reported above), 15 and 20 minutes of recovery with a borderline reduction in FEV1, which did not meet the criteria for EIB although technically it is positive due to the 10% drop in fev1 (the concomitant decrease in FVC may suggest respiratory muscle fatigue).  The RER of 1.07 indicates a near maximal effort. The peak  VO2 is low-normal at 17.6 ml/kg/min (82% of the age/gender/weight matched sedentary norm). The VO2 a the ventilatory threshold is normal at 54% of the predicted peak VO2. At peak exercise the ventilation was 47% of the measured MVV indicating ventilatory reserve remained (respiratory rate was relatively low at 24 bpm, PETCO2 is normal, Vt/IC is abnormally elevated at 90%). As mentioned above there was an inadequate HR response to the exercise with a HR reserve of 34 bpm. The O2pulse (a surrogate for stroke volume) increased throughout the exercise reaching 10 ml/beat (111% of predicted). The VE/VCO2 slope is mildly elevated indicating mildly reduced ventilatory efficiency. The oxygen uptake efficiency slope (OUES) is low normal and reflects the patient's measured exercise capacity.     Resting spirometry shows restriction which is consistent with diaphramatic paralysis. Exercise testing with gas exchange demonstrates a functional capacity at the lower limit of normal when compared to matched sedentary norms. The patient appears to be limited at peak exercise due to her elevated Vt/IC, which appears to be leading to a  sensation of not being able to get enough air when she inhales. Unclear if there is exercise induced bronchospasm or not. There does not appear to be a circulatory limitation to the exercise.   Past, Family, Social reviewed: no change since last visit   Review of Systems  Constitutional: Negative for fever and unexpected weight change.  HENT: Positive for sneezing. Negative for ear pain, nosebleeds, congestion, sore throat, rhinorrhea, trouble swallowing, dental problem, postnasal drip and sinus pressure.   Eyes: Negative for redness and itching.  Respiratory: Positive for shortness of breath and wheezing. Negative for cough and chest tightness.   Cardiovascular: Positive for palpitations. Negative for leg swelling.  Gastrointestinal: Negative for nausea and vomiting.  Genitourinary: Negative for dysuria.  Musculoskeletal: Negative for joint swelling.  Skin: Negative for rash.  Neurological: Positive for dizziness. Negative for headaches.       Pt states after the SOB episodes she becomes very dizzy, and increased heart rate/palpitations  Hematological: Does not bruise/bleed easily.  Psychiatric/Behavioral: Positive for confusion and decreased concentration. Negative for dysphoric mood. The patient is not nervous/anxious.        Objective:   Physical Exam Vitals reviewed. Constitutional: She is oriented to person, place, and time. She appears well-developed and well-nourished. No distress.  HENT:  Head: Normocephalic and atraumatic.  Right Ear: External ear normal.  Left Ear: External ear normal.  Mouth/Throat: Oropharynx is clear and moist. No oropharyngeal exudate.  Eyes: Conjunctivae and EOM are normal. Pupils are equal, round, and reactive to light. Right eye exhibits no discharge. Left eye exhibits no discharge. No scleral icterus.  Neck: Normal range of motion. Neck supple. No JVD present. No tracheal deviation present. No thyromegaly present.  Cardiovascular: Normal rate,  regular rhythm, normal heart sounds and intact distal pulses.  Exam reveals no gallop and no friction rub.   No murmur heard. Pulmonary/Chest: Effort normal. No respiratory distress. She has no wheezes. She has no rales. She exhibits no tenderness.       Decreased breath sound on left  Abdominal: Soft. Bowel sounds are normal. She exhibits no distension and no mass. There is no tenderness. There is no rebound and no guarding.  Musculoskeletal: Normal range of motion. She exhibits no edema and no tenderness.  Lymphadenopathy:    She has no cervical adenopathy.  Neurological: She is alert and oriented to person, place, and time. She has normal reflexes. No cranial nerve deficit. She exhibits  normal muscle tone. Coordination normal.  Skin: Skin is warm and dry. No rash noted. She is not diaphoretic. No erythema. No pallor.  Psychiatric: She has a normal mood and affect. Her behavior is normal. Judgment and thought content normal.           Assessment & Plan:

## 2012-03-22 NOTE — Patient Instructions (Signed)
I am perplexed about your persistent shortness of breath Please try a trial of dulera (100) 2 puff twice  Daily - take sample, show technique - call me back in few weeks to give feedback We will refer you to Dr Harlen Labs at [facility] for a 2nd opinion   - will try to get in asap; I will email him as well  - he might repeat sniff test or perform other tests  - will send all our tests and notes including cpst and cardiac stress test results to him Followup  - call me after you visit with him or call sooner or come sooner any new issues

## 2012-03-25 ENCOUNTER — Encounter: Payer: Self-pay | Admitting: Internal Medicine

## 2012-03-25 NOTE — Assessment & Plan Note (Signed)
I still have not found a etiology beyond diaphragmatic paralysis and weight gain for dyspnea. The only untreated abnormality could be the 10% drop in fev1 post CPST. I have recommended dulera (given samples) to try for several weeks while we wait for her to see Dr Katrine Coho at [facility], Followup date to be based on DR Govert's notes

## 2012-03-27 ENCOUNTER — Encounter (HOSPITAL_COMMUNITY): Payer: BC Managed Care – PPO

## 2012-03-28 ENCOUNTER — Ambulatory Visit: Payer: BC Managed Care – PPO | Admitting: Internal Medicine

## 2012-03-29 ENCOUNTER — Encounter (HOSPITAL_COMMUNITY): Payer: BC Managed Care – PPO

## 2012-04-03 ENCOUNTER — Encounter (HOSPITAL_COMMUNITY): Payer: BC Managed Care – PPO

## 2012-04-05 ENCOUNTER — Encounter (HOSPITAL_COMMUNITY): Payer: BC Managed Care – PPO

## 2012-04-10 ENCOUNTER — Encounter (HOSPITAL_COMMUNITY): Payer: Self-pay | Admitting: *Deleted

## 2012-04-10 ENCOUNTER — Encounter (HOSPITAL_COMMUNITY): Payer: BC Managed Care – PPO

## 2012-04-12 ENCOUNTER — Encounter (HOSPITAL_COMMUNITY): Payer: BC Managed Care – PPO

## 2012-04-17 ENCOUNTER — Encounter (HOSPITAL_COMMUNITY): Payer: BC Managed Care – PPO

## 2012-04-19 ENCOUNTER — Encounter (HOSPITAL_COMMUNITY): Payer: BC Managed Care – PPO

## 2012-04-24 ENCOUNTER — Encounter (HOSPITAL_COMMUNITY): Admission: RE | Admit: 2012-04-24 | Payer: BC Managed Care – PPO | Source: Ambulatory Visit

## 2012-05-04 ENCOUNTER — Telehealth: Payer: Self-pay | Admitting: Internal Medicine

## 2012-05-04 ENCOUNTER — Encounter: Payer: Self-pay | Admitting: *Deleted

## 2012-05-04 NOTE — Telephone Encounter (Signed)
Ok please do a draft as such and I will sign it

## 2012-05-04 NOTE — Telephone Encounter (Signed)
Letter completed and given to [facility] to have MR review and sign.  Will forward msg to MR so he is aware.

## 2012-05-04 NOTE — Telephone Encounter (Signed)
Spoke with pt's spouse and notified that letter is ready. He states will pick this up. Letter is up front.

## 2012-05-04 NOTE — Telephone Encounter (Signed)
Signed letter done and JC gave it back to triage

## 2012-05-04 NOTE — Telephone Encounter (Signed)
Spoke with pt's spouse. He states that pt has been having to work 14 hour days and this has been really tough for her. She wants to know if MR can write letter for her asking that she be limited to no more than 10 hrs per day. Please advise thanks

## 2012-05-07 NOTE — Progress Notes (Signed)
PULMONARY REHAB OUTCOMES REPORT  Patient has been discharged from the program for noncompliance with attendance. Patient has been called several times and no respond. Letter has been sent to patients home. Patient was able to complete 12 visits and maintained good vitals. Patient tolerated well on all exercise sessions. Patient was just "too busy" with returning to work. Patient still unable to conserve energy and not go a day without diaphragmatic pain. Patient needs continuing education on diaphragmatic breathing and pursed lip breathing. Patient needs reinforcement with home exercises. Patient was unable to attend regularly so per RN and EP rehab was not beneficial for her at this time. More than welcome to return with a new MD order.  Courtney L. Manson Passey, MS, NASM, CES  Pulmonary Rehab RD Outcomes Note There is some room for improvement for pt diet to become healthier.  Pt wt down 1 kg.   Section Completed by: Mickle Plumb, M.Ed, RD, LDN, CDE     Agree with above note.  Cathie Olden RN

## 2012-05-22 ENCOUNTER — Ambulatory Visit (INDEPENDENT_AMBULATORY_CARE_PROVIDER_SITE_OTHER)
Admission: RE | Admit: 2012-05-22 | Discharge: 2012-05-22 | Disposition: A | Discharge status: Home / Self Care | Payer: BC Managed Care – PPO | Source: Ambulatory Visit | Attending: Adult Health | Admitting: Adult Health

## 2012-05-22 ENCOUNTER — Ambulatory Visit (INDEPENDENT_AMBULATORY_CARE_PROVIDER_SITE_OTHER): Payer: BC Managed Care – PPO | Admitting: Adult Health

## 2012-05-22 ENCOUNTER — Telehealth: Payer: Self-pay | Admitting: Internal Medicine

## 2012-05-22 ENCOUNTER — Encounter: Payer: Self-pay | Admitting: Adult Health

## 2012-05-22 VITALS — BP 112/80 | HR 74 | Temp 97.4°F | Ht 64.0 in | Wt 158.6 lb

## 2012-05-22 DIAGNOSIS — R0602 Shortness of breath: Secondary | ICD-10-CM

## 2012-05-22 DIAGNOSIS — J986 Disorders of diaphragm: Secondary | ICD-10-CM

## 2012-05-22 MED ORDER — ALPRAZOLAM 0.25 MG PO TABS: 0.2500 mg | ORAL_TABLET | Freq: Every day | ORAL | Status: AC | PRN

## 2012-05-22 MED ORDER — ALPRAZOLAM 0.25 MG PO TABS: 0.2500 mg | ORAL_TABLET | Freq: Three times a day (TID) | ORAL | Status: DC | PRN

## 2012-05-22 NOTE — Telephone Encounter (Signed)
Closed by accident.  Will route to triage.

## 2012-05-22 NOTE — Patient Instructions (Addendum)
I will call with xray results.  Advance activity as tolerated.  follow up Dr. Marchelle Gearing as planned and As needed

## 2012-05-22 NOTE — Telephone Encounter (Signed)
Pt has scheduled OV with TP today at 11:30 am which was scheduled by [facility].

## 2012-05-22 NOTE — Telephone Encounter (Signed)
Called, spoke with pt's husband who reports pt c/o having trouble breathing today at rest and with exertion.  States this is mainly when she is outside -- has been outside most of the day as she works with [employer].  Unsure if pt is having wheezing or chest tightness.  Denies cough or fever.  I offered OV today at 10:15 am with VS but unable to make it -- requesting OV today after 12 pm.  Nothing available and no openings tomorrow at this time.  Tammy P, pls advise of any recs.  Thank you.  nkda- verified  [facility] [address] [place]

## 2012-05-22 NOTE — Telephone Encounter (Signed)
Called, spoke with pt's spouse.  I informed him of below per TP.  He verbalized understanding of these instructions.  Husband requesting to call pt prior to scheduling appt -- unsure if she will be able to come in today at 11:30 am.  If she cannot make appt today, he will ask her about coming in on Thursday to see TP.  States he will call back to schedule OV after speaking with pt.  He is aware to have pt go to ER or Urgent Care is symptoms do not improve or worsen.  Will hold in triage as msg has been closed to f/u on.

## 2012-05-22 NOTE — Assessment & Plan Note (Signed)
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  Advised to cut back to 8 hr shifs.  Check xray today  May use xanax As needed   If anxiety persists needs to see PCP for possible SSRI for anxiety  Please contact office for sooner follow up if symptoms do not improve or worsen or seek emergency care

## 2012-05-22 NOTE — Telephone Encounter (Signed)
Per TP: can work pt in at 11:30am.  If not able to come at that time, can see on Thursday.  ER or Urgent Care if symptoms do not improve or worsen.  Thanks.

## 2012-05-22 NOTE — Progress Notes (Signed)
Subjective:    Patient ID: Virginia Richardson, female    DOB: 1961/04/09, 51 y.o.   MRN: [id number]  HPI OV 11/16/2011 51 year old female. Works in [employer]. No medical issues. Remote smoker - quit 1984 after 8ppd smoking hx.  Reports multiple visits to [facility] to attend to sick mom (UTIs).   Reports dyspnea x 1 month but has gained weight due to hospital food as visitor. Due to mom'sillness did not have time to seek medical attention. Insidious onset. 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 Improved with rest. Stable since onset. Denies associated cough, wheezing, sputum, orthopnea, paroxysmal nocturnal dyspnea, chest pain. However, past few days notices some discomfort in Left infra-axillary region when she exerts or some tightness when she inhales deeply. Also associated hoarseness since 1 month  Mom got out of [facility] on 11/11/11 after opd procedure with supra public foley. Then on 11/12/11 Saturday felt she might be getting acutely ill. Then next day 11/13/11 Sunday nausea, unable to eat, vomiting that persisted on 11/14/11. Yesterday 11/15/11 spent time taking bed rest but noticed after cleaning up old stew off refrigerator. After walking 150feeet for that felt that head was going to split with headache and had nausea. Says last several days not eaten much. Eating only crackers. Taking anti-nausea meds and is helping. Today only had mash potatoes. Of note no fever per hx since Monday 11/14/11  Saw NP Lauretta Chester yesterday and CXR 11/25/11 at DR Kim's office shows new LEFT DIAPHRAGM ELEVATION compared to feb 2012 which was normal at that time (does not remember why cxr was done 1 year ago). CBC was normal. BMET normal. DC on Zpak with referral here.   Social: 51 year old mom. 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  Family: 2 catts. No kids. Spouse + who is non-smoker    OV 11/23/2011  FU dyspnea: still dyspneic. Stil with nausea esp with exposure to certain  foods. No vomiting.  Saw Dr Pollyann Kennedy ENT and per her hx: ENT eval was normal (note not available). Now recollects: that on 10/15/11 tripped and fell missing stairs fell on left side outstretched hand. Initially left ankle sprain and saw Dr Selena Batten 10/17/11 for left ankle sprain (xray negative per hx). Then around 10/24/11 woke up one morning and noticed left shoulder ache - sudden onset when she woke up. Underwent massage x 2 and chiropractor x 2 ( around xmas time to neck and back; felt crack noise). Pain still present but has moved to left supra-mammary area and husband feels this area is more pronounced. Dyspnea noticed after the fall but husband says intermittent nausea mild on and off several months.   REC I think you need MRI of the spine - I will talk to radiologist today and then order it  I will touch base with Dr Selena Batten about yoru nausea and Dr Pollyann Kennedy  My nurse will get Dr Lucky Rathke note  Stay off work till workup is complete  Followup based on MRI  Address insomnia at followup  OV 12/23/2011  Fu for left diphragm weakness/paralysis  with dyspnea out of propprtion. So has had more workup and the are the following  Additional Hx: chiropractor last yr and they were 7/23, 7/24, 10/12, 12/18, and 12/19. - hx of neck mainipulation. Also 15# weight gain iin past 2 months  ENT workup: note of Dr Pollyann Kennedy reviewed. No vocal cord paralysisi  ABG    Component Value  Date/Time   PHART 7.407* 11/30/2011 1315   PCO2ART 38.8 11/30/2011 1315   PO2ART 73.2* 11/30/2011 1315   HCO3 24.0 11/30/2011 1315   TCO2 21.2 11/30/2011 1315   ACIDBASEDEF 0.0 11/30/2011 1315   O2SAT 94.4 11/30/2011 1315   Jan/FEb 2013: Saw Dr. Nadara Eaton  And cardiac myoview stress test negative   12/13/11: PFts reviewd. 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   walk test did not show drop in oxygen   Here to discuss all of above. Of note, she nearly passed out afer ABG and went to ER and then referred to cardiology. Still having  exertional dyspnea for carrying cat, doing yard work, pushing grocery cart up incline. In addition, 3 attacks of self described panic attacks - sudden, random, when by herself resting, air hunger and feeling of impending doom and spontaneously settles. No associated tingling.   Also reporting significant GERD since illness srarted. PPI not helping. Taking TUMS. Diet is not great: drinks coffee, tea, and eats fried food.   Husband very concerned  #shortness of breath  - this is due to left diaphragm weakness/paralysis and likely panic attack as well  - I recommend you start pulmonary rehab - nurse will make referral  - I would recommend some weight loss; goal weight is 141# (current weight is 161#) but even if you lose 10# that could help you  - for panic attack try xanax 0.25mg  as needed (total 10 given, no refills). If this is helping you let me know  - I recommend seeing psychiatrist for panic attack as well but we can wait on this as discussed. 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 Based on that we decided to consider asthma Rx v rehab and opted for rehab and weight loss. She did attend rehab a bit but then discontinued. She returned to work due to $ issues. She never lost weight but seemed to be doing okay but recently past several weeks having dyspnea again. Reporting 2 types: one spontaneous random episodes that does not have all elements of panic but is closest to panic. She did have relief with xanax in past but this time did not take them acutely.  Though she took it the following day as preventative. In addition, after lifiting very heavy boxes and doing yard work got very dyspneic. She and husband are very frustrated by symptoms and are expressing willingness to see DR Harlen Labs at [facility]  CPST 4/.4/13: Conclusion:   Resting spirometry demonstrates a mild restrictive pattern consistent with her previous PFT evaluation. The MVV is normal. Post-exercise spirometry was performed IPE, 5, 10 (reported above), 15 and 20 minutes of recovery with a borderline reduction in FEV1, which did not meet the criteria for EIB although technically it is positive due to the 10% drop in fev1 (the concomitant decrease in FVC may suggest respiratory muscle fatigue).  The RER of 1.07 indicates a near maximal effort. The peak VO2 is low-normal  at 17.6 ml/kg/min (82% of the age/gender/weight matched sedentary norm). The VO2 a the ventilatory threshold is normal at 54% of the predicted peak VO2. At peak exercise the ventilation was 47% of the measured MVV indicating ventilatory reserve remained (respiratory rate was relatively low at 24 bpm, PETCO2 is normal, Vt/IC is abnormally elevated at 90%). As mentioned above there was an inadequate HR response to the exercise with a HR reserve of 34 bpm. The O2pulse (a surrogate for stroke volume) increased throughout the exercise reaching 10 ml/beat (111% of predicted). The VE/VCO2 slope is mildly elevated indicating mildly reduced ventilatory efficiency. The oxygen uptake efficiency slope (OUES) is low normal and reflects the patient's measured exercise capacity.  Resting spirometry shows restriction which is consistent with diaphramatic paralysis. Exercise testing with gas exchange demonstrates a functional capacity at the lower limit of normal when compared to matched sedentary norms. The patient appears to be limited at peak exercise due to her elevated Vt/IC, which appears to be leading to a sensation of not  being able to get enough air when she inhales. Unclear if there is exercise induced bronchospasm or not. There does not appear to be a circulatory limitation to the exercise.  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 W/ rest breaks  She says she is having great difficulty at work, she has to work 12-13 hr shift with heavy lifting and exposure heat. Today she got very short of breath, lightheaded .  She is very nervous today however she says she is not anxious.  Husband says she has been jittery It was recommended last ov that she see someone for possible anxiety component.  She did not follow up with this. Did use xanax  Briefly with some help.  She is very concerned that her job is so demanding. She feels very exhausted.  No chest pain, edema, visual /speech changes, ext weakness, palpitations.    Review of Systems  Constitutional:   No  weight loss, night sweats,  Fevers, chills, + fatigue, or  lassitude.  HEENT:   No headaches,  Difficulty swallowing,  Tooth/dental problems, or  Sore throat,                No sneezing, itching, ear ache, nasal congestion, post nasal drip,   CV:  No chest pain,  Orthopnea, PND, swelling in lower extremities, anasarca, dizziness, palpitations, syncope.   GI  No heartburn, indigestion, abdominal pain, nausea, vomiting, diarrhea, change in bowel habits, loss of appetite, bloody stools.   Resp:  No excess mucus, no productive cough,  No non-productive cough,  No coughing up of blood.  No change in color of mucus.  No wheezing.  No chest wall deformity  Skin: no rash  or lesions.  GU: no dysuria, change in color of urine, no urgency or frequency.  No flank pain, no hematuria   MS:  No joint pain or swelling.  No decreased range of motion.  No back pain.  Psych:  No change in mood or affect.  .  No memory loss.          Objective:  Physical Exam GEN: A/Ox3; pleasant , NAD, well nourished , very anxious, jittery   HEENT:  [place]/AT,  EACs-clear, TMs-wnl, NOSE-clear, THROAT-clear, no lesions, no postnasal drip or exudate noted.   NECK:  Supple w/ fair ROM; no JVD; normal carotid impulses w/o bruits; no thyromegaly or nodules palpated; no lymphadenopathy.  RESP  Clear  P & A; w/o, wheezes/ rales/ or rhonchi.no accessory muscle use, no dullness to percussion  CARD:  RRR, no m/r/g  , no peripheral edema, pulses intact, no cyanosis or clubbing.  GI:   Soft & nt; nml bowel sounds; no organomegaly or masses detected.  Musco: Warm bil, no deformities or joint swelling noted.   Neuro: alert, no focal deficits noted.  CN 2-12 intact, nml gait, nml grips, neg pronator drift.    Skin: Warm, no lesions or rashes          Assessment & Plan:

## 2012-05-22 NOTE — Telephone Encounter (Signed)
Husband called back - reports he spoke with pt who states she does have trouble breathing when inside and outside but much worse when outside.  Also, c/o intermittent sharp pain under right breast.  Denies wheezing, fever, or cough.

## 2012-05-23 NOTE — Progress Notes (Signed)
Quick Note:  Called patient, no answer. Message left on machine to return call. ______

## 2012-06-12 ENCOUNTER — Telehealth: Payer: Self-pay | Admitting: Internal Medicine

## 2012-06-12 NOTE — Telephone Encounter (Signed)
I spoke with spouse and he states pt is needing a revised letter for her work.  She states [employer] is splitting up her shifts and is working crazy times during the day. He spoke with [employer] and was advised MR would need to write a letter stating it was his medical opinion pt should not work over 8 hrs a day from start to finish including breaks as well as include a time period for this. I advised spouse MR is not in the office until 06/14/12. He was fine with this. Please advise MR thanks

## 2012-06-14 NOTE — Telephone Encounter (Signed)
Forwarding to [place] to get this done

## 2012-06-15 ENCOUNTER — Encounter: Payer: Self-pay | Admitting: *Deleted

## 2012-06-15 NOTE — Telephone Encounter (Signed)
Pt spouse aware that letter has been completed and placed at front for pick-up. [name], CMA

## 2012-06-15 NOTE — Telephone Encounter (Signed)
Pt's spouse Ron called back. He says the "3-9 month range sounds good". He wants to know how soon he would be able to pick letter up?

## 2012-06-15 NOTE — Telephone Encounter (Signed)
Dr. Marchelle Gearing, what time period do you want to do these restrictions for? [name], CMA

## 2012-06-15 NOTE — Telephone Encounter (Signed)
Check with patient but I am thinking 3-9 month range

## 2012-06-18 ENCOUNTER — Telehealth: Payer: Self-pay | Admitting: Internal Medicine

## 2012-06-18 NOTE — Telephone Encounter (Signed)
Pt came to office to pick up letter for work (dated 8.6.13) and stated that it should read that pt should not work more than 10 hours per day rather than 8 (though 8 hours is documented in the phone message).  Spoke with Victorino Dike, MR's nurse, who okayed for the letter to be changed.  Per pt, ok for MR's stamped signature rather than a hand-written signature.  Letter done and given back to [place] to hand to pt in the lobby.  Will sign off.

## 2012-06-28 ENCOUNTER — Ambulatory Visit: Payer: BC Managed Care – PPO | Admitting: Internal Medicine

## 2012-06-29 ENCOUNTER — Encounter: Payer: Self-pay | Admitting: Internal Medicine

## 2012-06-29 ENCOUNTER — Encounter: Payer: Self-pay | Admitting: *Deleted

## 2012-06-29 ENCOUNTER — Ambulatory Visit (INDEPENDENT_AMBULATORY_CARE_PROVIDER_SITE_OTHER): Payer: BC Managed Care – PPO | Admitting: Internal Medicine

## 2012-06-29 VITALS — BP 122/88 | HR 72 | Temp 97.0°F | Ht 64.0 in | Wt 154.6 lb

## 2012-06-29 DIAGNOSIS — R0600 Dyspnea, unspecified: Secondary | ICD-10-CM

## 2012-06-29 DIAGNOSIS — R0989 Other specified symptoms and signs involving the circulatory and respiratory systems: Secondary | ICD-10-CM

## 2012-06-29 DIAGNOSIS — F410 Panic disorder [episodic paroxysmal anxiety] without agoraphobia: Secondary | ICD-10-CM

## 2012-06-29 NOTE — Assessment & Plan Note (Signed)
Physcially dyspnea is due to Body mass index is 26.54 kg/(m^2). 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 Not sure if there is an element of panic attack intertwined (at times hx is positive, at times negative). But xanax appears to help. Her stress reaction dueo demands of the job are high. SHe repeatedly has asked ofr time off from work but refuses to quit job. Unclear if there is secondary gain  Plan laid out  - Given extreme symptoms with work and related stress  - take note to be off work for 1 month  - continue to see pscyhologist  - refer to psychiatry for evaluation of potential panic attacks  - join gym and do weight training and high intensity interval training - get help from trainer initially for this and do not overdo  Return to see me in 6-8 weeks for folllowup  At followup I might  Have to lay down expectations for weight loss and muscle strengthening more firmly   > 50% of this > 25 min visit spent in face to face counseling (15 min visit converted to 25 min)

## 2012-06-29 NOTE — Patient Instructions (Addendum)
Given extreme symptoms with work and related stress  - take note to be off work for 1 month  - continue to see pscyhologist  - refer to psychiatry for evaluation of potential panic attacks  - join gym and do weight training and high intensity interval training - get help from trainer initially for this and do not overdo  Return to see me in 6-8 weeks for folllowup

## 2012-06-29 NOTE — Progress Notes (Signed)
Subjective:    Patient ID: Virginia Richardson, female    DOB: 21-Jul-1961, 51 y.o.   MRN: [id number]  HPI OV 11/16/2011 51 year old female. Works in [employer]. No medical issues. Remote smoker - quit 1984 after 8ppd smoking hx.  Reports multiple visits to [facility] to attend to sick mom (UTIs).   Reports dyspnea x 1 month but has gained weight due to hospital food as visitor. Due to mom'sillness did not have time to seek medical attention. Insidious onset. 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 Improved with rest. Stable since onset. Denies associated cough, wheezing, sputum, orthopnea, paroxysmal nocturnal dyspnea, chest pain. However, past few days notices some discomfort in Left infra-axillary region when she exerts or some tightness when she inhales deeply. Also associated hoarseness since 1 month  Mom got out of [facility] on 11/11/11 after opd procedure with supra public foley. Then on 11/12/11 Saturday felt she might be getting acutely ill. Then next day 11/13/11 Sunday nausea, unable to eat, vomiting that persisted on 11/14/11. Yesterday 11/15/11 spent time taking bed rest but noticed after cleaning up old stew off refrigerator. After walking [id number] for that felt that head was going to split with headache and had nausea. Says last several days not eaten much. Eating only crackers. Taking anti-nausea meds and is helping. Today only had mash potatoes. Of note no fever per hx since Monday 11/14/11  Saw NP Lauretta Chester yesterday and CXR 11/25/11 at DR Kim's office shows new LEFT DIAPHRAGM ELEVATION compared to feb 2012 which was normal at that time (does not remember why cxr was done 1 year ago). CBC was normal. BMET normal. DC on Zpak with referral here.   Social: 26 year old mom. 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  Family: 2 catts. No kids. Spouse + who is non-smoker    OV 11/23/2011  FU dyspnea: still dyspneic. Stil with nausea esp with exposure to certain  foods. No vomiting.  Saw Dr Pollyann Kennedy ENT and per her hx: ENT eval was normal (note not available). Now recollects: that on 10/15/11 tripped and fell missing stairs fell on left side outstretched hand. Initially left ankle sprain and saw Dr Selena Batten 10/17/11 for left ankle sprain (xray negative per hx). Then around 10/24/11 woke up one morning and noticed left shoulder ache - sudden onset when she woke up. Underwent massage x 2 and chiropractor x 2 ( around xmas time to neck and back; felt crack noise). Pain still present but has moved to left supra-mammary area and husband feels this area is more pronounced. Dyspnea noticed after the fall but husband says intermittent nausea mild on and off several months.   REC I think you need MRI of the spine - I will talk to radiologist today and then order it  I will touch base with Dr Selena Batten about yoru nausea and Dr Pollyann Kennedy  My nurse will get Dr Lucky Rathke note  Stay off work till workup is complete  Followup based on MRI  Address insomnia at followup  OV 12/23/2011  Fu for left diphragm weakness/paralysis  with dyspnea out of propprtion. So has had more workup and the are the following  Additional Hx: chiropractor last yr and they were 7/23, 7/24, 10/12, 12/18, and 12/19. - hx of neck mainipulation. Also 15# weight gain iin past 2 months  ENT workup: note of Dr Pollyann Kennedy reviewed. No vocal cord paralysisi  ABG    Component Value  Date/Time   PHART 7.407* 11/30/2011 1315   PCO2ART 38.8 11/30/2011 1315   PO2ART 73.2* 11/30/2011 1315   HCO3 24.0 11/30/2011 1315   TCO2 21.2 11/30/2011 1315   ACIDBASEDEF 0.0 11/30/2011 1315   O2SAT 94.4 11/30/2011 1315   Jan/FEb 2013: Saw Dr. Nadara Eaton  And cardiac myoview stress test negative   12/13/11: PFts reviewd. 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   walk test did not show drop in oxygen   Here to discuss all of above. Of note, she nearly passed out afer ABG and went to ER and then referred to cardiology. Still having  exertional dyspnea for carrying cat, doing yard work, pushing grocery cart up incline. In addition, 3 attacks of self described panic attacks - sudden, random, when by herself resting, air hunger and feeling of impending doom and spontaneously settles. No associated tingling.   Also reporting significant GERD since illness srarted. PPI not helping. Taking TUMS. Diet is not great: drinks coffee, tea, and eats fried food.   Husband very concerned  #shortness of breath  - this is due to left diaphragm weakness/paralysis and likely panic attack as well  - I recommend you start pulmonary rehab - nurse will make referral  - I would recommend some weight loss; goal weight is 141# (current weight is 161#) but even if you lose 10# that could help you  - for panic attack try xanax 0.25mg  as needed (total 10 given, no refills). If this is helping you let me know  - I recommend seeing psychiatrist for panic attack as well but we can wait on this as discussed. You can think about this and let me know  - If you wish to pursue more on shortness of breath then next step is a CPST bike test and/or referral to 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 Based on that we decided to consider asthma Rx v rehab and opted for rehab and weight loss. She did attend rehab a bit but then discontinued. She returned to work due to $ issues. She never lost weight but seemed to be doing okay but recently past several weeks having dyspnea again. Reporting 2 types: one spontaneous random episodes that does not have all elements of panic but is closest to panic. She did have relief with xanax in past but this time did not take them acutely.  Though she took it the following day as preventative. In addition, after lifiting very heavy boxes and doing yard work got very dyspneic. She and husband are very frustrated by symptoms and are expressing willingness to see DR Harlen Labs at [facility]  CPST 4/.4/13: Conclusion:   Resting spirometry demonstrates a mild restrictive pattern consistent with her previous PFT evaluation. The MVV is normal. Post-exercise spirometry was performed IPE, 5, 10 (reported above), 15 and 20 minutes of recovery with a borderline reduction in FEV1, which did not meet the criteria for EIB although technically it is positive due to the 10% drop in fev1 (the concomitant decrease in FVC may suggest respiratory muscle fatigue).  The RER of 1.07 indicates a near maximal effort. The peak VO2 is low-normal  at 17.6 ml/kg/min (82% of the age/gender/weight matched sedentary norm). The VO2 a the ventilatory threshold is normal at 54% of the predicted peak VO2. At peak exercise the ventilation was 47% of the measured MVV indicating ventilatory reserve remained (respiratory rate was relatively low at 24 bpm, PETCO2 is normal, Vt/IC is abnormally elevated at 90%). As mentioned above there was an inadequate HR response to the exercise with a HR reserve of 34 bpm. The O2pulse (a surrogate for stroke volume) increased throughout the exercise reaching 10 ml/beat (111% of predicted). The VE/VCO2 slope is mildly elevated indicating mildly reduced ventilatory efficiency. The oxygen uptake efficiency slope (OUES) is low normal and reflects the patient's measured exercise capacity.  Resting spirometry shows restriction which is consistent with diaphramatic paralysis. Exercise testing with gas exchange demonstrates a functional capacity at the lower limit of normal when compared to matched sedentary norms. The patient appears to be limited at peak exercise due to her elevated Vt/IC, which appears to be leading to a sensation of not  being able to get enough air when she inhales. Unclear if there is exercise induced bronchospasm or not. There does not appear to be a circulatory limitation to the exercise.  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 W/ rest breaks  She says she is having great difficulty at work, she has to work 12-13 hr shift with heavy lifting and exposure heat. Today she got very short of breath, lightheaded .  She is very nervous today however she says she is not anxious.  Husband says she has been jittery It was recommended last ov that she see someone for possible anxiety component.  She did not follow up with this. Did use xanax  Briefly with some help.  She is very concerned that her job is so demanding. She feels very exhausted.  No chest pain, edema, visual /speech changes, ext weakness, palpitations.    I will call with xray results.  Advance activity as tolerated.  follow up Dr. Marchelle Gearing as planned and As needed    OV 06/29/2012 followup diaphragm paralysis (unilateral and left) felt due to [facility] syndrom  Presents with husband. Repports significant dyspnea at work while carrying heavy 80# boxes.  She is upset that work is not treating her well despite > 20 year emplyment. Being given difficult routes, long hours. This is stressing her. Denies panic attack style dyspnea but there appears to be element of anticipatory dyspnea as well while needing to lift heavy boxes. Very stressed as a result. Very emotional in office. Sobbing. Husband and she feel they have not  reassigned her work despite my letter. They feel she needs 1 month off from work. She plans to visit with psychiatrist. She has started seeing a psychologist.    Past, Family, Social reviewed: no change since last visit   Review of Systems  Constitutional: Negative for fever and unexpected weight change.  HENT: Negative for ear pain, nosebleeds, congestion, sore throat, rhinorrhea, sneezing, trouble swallowing, dental problem, postnasal drip and sinus pressure.   Eyes: Negative for redness and itching.  Respiratory: Negative for  cough, chest tightness, shortness of breath and wheezing.   Cardiovascular: Negative for palpitations and leg swelling.  Gastrointestinal: Negative for nausea and vomiting.  Genitourinary: Negative for dysuria.  Musculoskeletal: Negative for joint swelling.  Skin: Negative for rash.  Neurological: Negative for headaches.  Hematological: Does not bruise/bleed easily.  Psychiatric/Behavioral: Negative for dysphoric mood. The patient is not nervous/anxious.        Objective:   Physical Exam GEN: A/Ox3; pleasant , NAD, well nourished , very anxious, jittery   HEENT:  [place]/AT,  EACs-clear, TMs-wnl, NOSE-clear, THROAT-clear, no lesions, no postnasal drip or exudate noted.   NECK:  Supple w/ fair ROM; no JVD; normal carotid impulses w/o bruits; no thyromegaly or nodules palpated; no lymphadenopathy.  RESP  Clear  P & A; w/o, wheezes/ rales/ or rhonchi.no accessory muscle use, no dullness to percussion  CARD:  RRR, no m/r/g  , no peripheral edema, pulses intact, no cyanosis or clubbing.  GI:   Soft & nt; nml bowel sounds; no organomegaly or masses detected.  Musco: Warm bil, no deformities or joint swelling noted.   Neuro: alert, no focal deficits noted.  CN 2-12 intact, nml gait, nml grips, neg pronator drift.    Skin: Warm, no lesions or rashes  Pscyh: tearful, anxious         Assessment & Plan:

## 2012-07-20 ENCOUNTER — Encounter: Payer: Self-pay | Admitting: Internal Medicine

## 2012-07-20 ENCOUNTER — Ambulatory Visit (INDEPENDENT_AMBULATORY_CARE_PROVIDER_SITE_OTHER): Payer: BC Managed Care – PPO | Admitting: Internal Medicine

## 2012-07-20 VITALS — BP 112/70 | HR 63 | Temp 97.7°F | Ht 64.0 in | Wt 153.8 lb

## 2012-07-20 DIAGNOSIS — R0600 Dyspnea, unspecified: Secondary | ICD-10-CM

## 2012-07-20 DIAGNOSIS — R0609 Other forms of dyspnea: Secondary | ICD-10-CM

## 2012-07-20 DIAGNOSIS — Z23 Encounter for immunization: Secondary | ICD-10-CM

## 2012-07-20 NOTE — Progress Notes (Signed)
Subjective:    Patient ID: Virginia Richardson, female    DOB: [birth year], 51 y.o.   MRN: [id number]  HPI  OV 11/16/2011 51 year old female. Works in [employer]. No medical issues. Remote smoker - quit 1984 after 8ppd smoking hx.  Reports multiple visits to [facility] to attend to sick mom (UTIs).   Reports dyspnea x 1 month but has gained weight due to hospital food as visitor. Due to mom'sillness did not have time to seek medical attention. Insidious onset. 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 Improved with rest. Stable since onset. Denies associated cough, wheezing, sputum, orthopnea, paroxysmal nocturnal dyspnea, chest pain. However, past few days notices some discomfort in Left infra-axillary region when she exerts or some tightness when she inhales deeply. Also associated hoarseness since 1 month  Mom got out of [facility] on 11/11/11 after opd procedure with supra public foley. Then on 11/12/11 Saturday felt she might be getting acutely ill. Then next day 11/13/11 Sunday nausea, unable to eat, vomiting that persisted on 11/14/11. Yesterday 11/15/11 spent time taking bed rest but noticed after cleaning up old stew off refrigerator. After walking 124feeet for that felt that head was going to split with headache and had nausea. Says last several days not eaten much. Eating only crackers. Taking anti-nausea meds and is helping. Today only had mash potatoes. Of note no fever per hx since Monday 11/14/11  Saw NP Lauretta Chester yesterday and CXR 11/25/11 at DR Kim's office shows new LEFT DIAPHRAGM ELEVATION compared to feb 2012 which was normal at that time (does not remember why cxr was done 1 year ago). CBC was normal. BMET normal. DC on Zpak with referral here.   Social: 21 year old mom. 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  Family: 2 catts. No kids. Spouse + who is non-smoker    OV 11/23/2011  FU dyspnea: still dyspneic. Stil with nausea esp with exposure to  certain foods. No vomiting.  Saw Dr Pollyann Kennedy ENT and per her hx: ENT eval was normal (note not available). Now recollects: that on 10/15/11 tripped and fell missing stairs fell on left side outstretched hand. Initially left ankle sprain and saw Dr Selena Batten 10/17/11 for left ankle sprain (xray negative per hx). Then around 10/24/11 woke up one morning and noticed left shoulder ache - sudden onset when she woke up. Underwent massage x 2 and chiropractor x 2 ( around xmas time to neck and back; felt crack noise). Pain still present but has moved to left supra-mammary area and husband feels this area is more pronounced. Dyspnea noticed after the fall but husband says intermittent nausea mild on and off several months.   REC I think you need MRI of the spine - I will talk to radiologist today and then order it  I will touch base with Dr Selena Batten about yoru nausea and Dr Pollyann Kennedy  My nurse will get Dr Lucky Rathke note  Stay off work till workup is complete  Followup based on MRI  Address insomnia at followup  OV 12/23/2011  Fu for left diphragm weakness/paralysis  with dyspnea out of propprtion. So has had more workup and the are the following  Additional Hx: chiropractor last yr and they were 7/23, 7/24, 10/12, 12/18, and 12/19. - hx of neck mainipulation. Also 15# weight gain iin past 2 months  ENT workup: note of Dr Pollyann Kennedy reviewed. No vocal cord paralysisi  ABG    Component  Value Date/Time   PHART 7.407* 11/30/2011 1315   PCO2ART 38.8 11/30/2011 1315   PO2ART 73.2* 11/30/2011 1315   HCO3 24.0 11/30/2011 1315   TCO2 21.2 11/30/2011 1315   ACIDBASEDEF 0.0 11/30/2011 1315   O2SAT 94.4 11/30/2011 1315   Jan/FEb 2013: Saw Dr. Nadara Eaton  And cardiac myoview stress test negative   12/13/11: PFts reviewd. 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   walk test did not show drop in oxygen   Here to discuss all of above. Of note, she nearly passed out afer ABG and went to ER and then referred to cardiology. Still  having exertional dyspnea for carrying cat, doing yard work, pushing grocery cart up incline. In addition, 3 attacks of self described panic attacks - sudden, random, when by herself resting, air hunger and feeling of impending doom and spontaneously settles. No associated tingling.   Also reporting significant GERD since illness srarted. PPI not helping. Taking TUMS. Diet is not great: drinks coffee, tea, and eats fried food.   Husband very concerned  #shortness of breath  - this is due to left diaphragm weakness/paralysis and likely panic attack as well  - I recommend you start pulmonary rehab - nurse will make referral  - I would recommend some weight loss; goal weight is 141# (current weight is 161#) but even if you lose 10# that could help you  - for panic attack try xanax 0.25mg  as needed (total 10 given, no refills). If this is helping you let me know  - I recommend seeing psychiatrist for panic attack as well but we can wait on this as discussed. 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 Based on that we decided to consider asthma Rx v rehab and opted for rehab and weight loss. She did attend rehab a bit but then discontinued. She returned to work due to $ issues. She never lost weight but seemed to be doing okay but recently past several weeks having dyspnea again. Reporting 2 types: one spontaneous random episodes that does not have all elements of panic but is closest to panic. She did have relief with xanax in past but this time did not take them  acutely. Though she took it the following day as preventative. In addition, after lifiting very heavy boxes and doing yard work got very dyspneic. She and husband are very frustrated by symptoms and are expressing willingness to see DR Harlen Labs at [facility]  CPST 4/.4/13: Conclusion:   Resting spirometry demonstrates a mild restrictive pattern consistent with her previous PFT evaluation. The MVV is normal. Post-exercise spirometry was performed IPE, 5, 10 (reported above), 15 and 20 minutes of recovery with a borderline reduction in FEV1, which did not meet the criteria for EIB although technically it is positive due to the 10% drop in fev1 (the concomitant decrease in FVC may suggest respiratory muscle fatigue).  The RER of 1.07 indicates a near maximal effort. The peak VO2 is  low-normal at 17.6 ml/kg/min (82% of the age/gender/weight matched sedentary norm). The VO2 a the ventilatory threshold is normal at 54% of the predicted peak VO2. At peak exercise the ventilation was 47% of the measured MVV indicating ventilatory reserve remained (respiratory rate was relatively low at 24 bpm, PETCO2 is normal, Vt/IC is abnormally elevated at 90%). As mentioned above there was an inadequate HR response to the exercise with a HR reserve of 34 bpm. The O2pulse (a surrogate for stroke volume) increased throughout the exercise reaching 10 ml/beat (111% of predicted). The VE/VCO2 slope is mildly elevated indicating mildly reduced ventilatory efficiency. The oxygen uptake efficiency slope (OUES) is low normal and reflects the patient's measured exercise capacity.  Resting spirometry shows restriction which is consistent with diaphramatic paralysis. Exercise testing with gas exchange demonstrates a functional capacity at the lower limit of normal when compared to matched sedentary norms. The patient appears to be limited at peak exercise due to her elevated Vt/IC, which appears to be leading to a  sensation of not being able to get enough air when she inhales. Unclear if there is exercise induced bronchospasm or not. There does not appear to be a circulatory limitation to the exercise.  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 W/ rest breaks  She says she is having great difficulty at work, she has to work 12-13 hr shift with heavy lifting and exposure heat. Today she got very short of breath, lightheaded .  She is very nervous today however she says she is not anxious.  Husband says she has been jittery It was recommended last ov that she see someone for possible anxiety component.  She did not follow up with this. Did use xanax  Briefly with some help.  She is very concerned that her job is so demanding. She feels very exhausted.  No chest pain, edema, visual /speech changes, ext weakness, palpitations.    I will call with xray results.  Advance activity as tolerated.  follow up Dr. Marchelle Gearing as planned and As needed    OV 06/29/2012 followup diaphragm paralysis (unilateral and left) felt due to [facility] syndrom  Presents with husband. Repports significant dyspnea at work while carrying heavy 80# boxes.  She is upset that work is not treating her well despite > 20 year emplyment. Being given difficult routes, long hours. This is stressing her. Denies panic attack style dyspnea but there appears to be element of anticipatory dyspnea as well while needing to lift heavy boxes. Very stressed as a result. Very emotional in office. Sobbing. Husband and she feel they  have not reassigned her work despite my letter. They feel she needs 1 month off from work. She plans to visit with psychiatrist. She has started seeing a psychologist.    Past, Family, Social reviewed: no change since last visit  REC Given extreme symptoms with work and related stress  - take note to be off work for 1 month  - continue to see pscyhologist  - refer to psychiatry for evaluation of potential panic attacks  - join gym and do weight training  and high intensity interval training - get help from trainer initially for this and do not overdo  Return to see me in 6-8 weeks for folllowup    OV 07/20/2012 Body mass index is 26.40 kg/(m^2).   Returns for followup. Since last OV, walking daily and jogging. Able to do walk 2 miles stretches without problems. She reports some weight loss. BMI 07/20/2012 is Body mass index is 26.40 kg/(m^2).  She is yet to [employer] but she is looking at options and evaluting the [employer] and [employer] fitness and evaluating personal trainers. She is talking to  Psychologist. An appt with pscyhiatrist pending end sept 2012.  She plans to return to work around end of this month but is worried about her ability to cope at work and wants to know what the right choice would be.  Past, Family, Social reviewed: no change since last visit     Review of Systems  Constitutional: Negative for fever, chills, diaphoresis, activity change, appetite change, fatigue and unexpected weight change.  HENT: Negative for hearing loss, ear pain, nosebleeds, congestion, sore throat, facial swelling, rhinorrhea, sneezing, mouth sores, trouble swallowing, neck pain, neck stiffness, dental problem, voice change, postnasal drip, sinus pressure, tinnitus and ear discharge.        Head cold x 2 weeks ago  Eyes: Negative for photophobia, discharge, itching and visual disturbance.  Respiratory: Positive for shortness of breath. Negative for apnea, cough, choking, chest tightness, wheezing and  stridor.   Cardiovascular: Negative for chest pain, palpitations and leg swelling.  Gastrointestinal: Negative for nausea, vomiting, abdominal pain, constipation, blood in stool and abdominal distention.  Genitourinary: Negative for dysuria, urgency, frequency, hematuria, flank pain, decreased urine volume and difficulty urinating.  Musculoskeletal: Negative for myalgias, back pain, joint swelling, arthralgias and gait problem.  Skin: Negative for color change, pallor and rash.  Neurological: Negative for dizziness, tremors, seizures, syncope, speech difficulty, weakness, light-headedness, numbness and headaches.  Hematological: Negative for adenopathy. Does not bruise/bleed easily.  Psychiatric/Behavioral: Negative for confusion, disturbed wake/sleep cycle and agitation. The patient is not nervous/anxious.        Objective:   Physical Exam  Alert and pleasant GCS 15   discusison only visit      Assessment & Plan:

## 2012-07-20 NOTE — Assessment & Plan Note (Addendum)
EXdertional component - unclear if better or not. Clearly she has had dyspnea with heavy boxes so far but current jogging and walking few miles might not be intense enough to mimic exertions with heavy boxes. I have strongly encouraged her to focus on nutiriton (10-15# weight loss), resistance training and High Intensity Interval Trainng to get fitness and help attempt to cope with weak diaphragm at work  ? Panic component - we will see what pscyhiatry feels about this; if this exists or not. Till she sees psych she will have a xanax refil from me.   > 50% of this 15 min visit spent in face to face counseling

## 2012-07-20 NOTE — Patient Instructions (Addendum)
Have flu shot today Please continue to focus on   - 10-15# weight loss through nutrition - eat low glycemic diet - take sheet from [place], talk to Dr Selena Batten about that  - aerobic training - do high intensity interval training 3 times a week with help of training  - resistance training - core and overall muscle strengthening - do with help of trainer Take xanax 10 tablet with 1 refill See psychiatrist Continue seeing psychologist Return to see me in 4 months or sooner if needed

## 2012-07-24 ENCOUNTER — Telehealth: Payer: Self-pay | Admitting: Internal Medicine

## 2012-07-24 MED ORDER — ALPRAZOLAM 0.25 MG PO TABS: 0.2500 mg | ORAL_TABLET | Freq: Every day | ORAL | Status: DC | PRN

## 2012-07-24 NOTE — Telephone Encounter (Signed)
Returning call can be reached at [phone].Virginia Richardson

## 2012-07-24 NOTE — Telephone Encounter (Signed)
OV note 07/20/12: Patient Instructions     Have flu shot today  Please continue to focus on  - 10-15# weight loss through nutrition - eat low glycemic diet - take sheet from [place], talk to Dr Selena Batten about that  - aerobic training - do high intensity interval training 3 times a week with help of training  - resistance training - core and overall muscle strengthening - do with help of trainer  Take xanax 10 tablet with 1 refill  See psychiatrist  Continue seeing psychologist  Return to see me in 4 months or sooner if needed    lmomtcb x1 for pt--did she already refill xanax??

## 2012-07-24 NOTE — Telephone Encounter (Signed)
PT STATES SHE WAS TOLD WHEN HERE FOR OV THAT HER PRESCRIPTION WOULD BE CALLED INTO THE PHARMACY AND THIS DID NOT HAPPEN. iT DOES NOT LOOK LIKE HER MEDICATION LIST WAS UPDATED WITH ANY REFILLS. i HAVE CALLED aLPRAZOLAM INTO HER PHARMACY AS WAS WRITTEN ON DISCHARGE FROM OV.

## 2012-07-25 ENCOUNTER — Telehealth: Payer: Self-pay | Admitting: Internal Medicine

## 2012-07-25 NOTE — Telephone Encounter (Signed)
Form placed in MR look-at. Virginia Richardson, CMA  

## 2012-07-27 NOTE — Telephone Encounter (Signed)
Pt called back. Asks that MR put a return to work date of 07-31-12 (next tues) and fax today to: [phone] attn: deanna marsh. Virginia Richardson

## 2012-07-30 NOTE — Telephone Encounter (Signed)
Pt called back. 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 Hazel Sams

## 2012-07-30 NOTE — Telephone Encounter (Signed)
I called the pt and advised that MR has the paperwork but that he is not in the office again until tomorrow morning so I will have the paperwork completed tomorrow and faxed after clinic. Pt states understanding. [name], CMA

## 2012-07-31 ENCOUNTER — Telehealth: Payer: Self-pay | Admitting: *Deleted

## 2012-07-31 NOTE — Telephone Encounter (Signed)
Patient called and wanted to make sure that Dr. Marchelle Gearing adds in her FMLA paperwork the 10 hour work restriction she has before the paperwork is faxed off. She states Dr. Marchelle Gearing is aware of this restriction. Will forward to MR and Victorino Dike so they are aware.

## 2012-07-31 NOTE — Telephone Encounter (Signed)
plese find out what if any weight restriction they want ?

## 2012-07-31 NOTE — Telephone Encounter (Signed)
Spoke with pt. She states that she wants not restrictions on how much wt that she can lift. 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 Thanks

## 2012-07-31 NOTE — Telephone Encounter (Signed)
ATC, no answer. LMOMTCB 

## 2012-07-31 NOTE — Telephone Encounter (Signed)
Requesting to speak to nurse about work hour restrictions.

## 2012-08-01 NOTE — Telephone Encounter (Signed)
See other phone note dated 07/31/12

## 2012-08-01 NOTE — Telephone Encounter (Signed)
LMTCB

## 2012-08-02 NOTE — Telephone Encounter (Signed)
Ok great. Please fax it after filling those boxes.

## 2012-08-02 NOTE — Telephone Encounter (Signed)
Paperwork faxed and also copy mailed to pt. Pt is aware. [name], CMA

## 2012-08-02 NOTE — Telephone Encounter (Signed)
Spoke with the pt and she states that [employer] has a weight limit of 70lbs so her packages will not be more then that, it is to be released for Full duty, and pt states we can ut today's date so she can go back as soon as possible. [name], CMA

## 2012-08-02 NOTE — Telephone Encounter (Signed)
She has always been dysponeic lifting heavy objects at [employer]. Why is she saying no weight restrictions. I tihnk 75#  Too much for her ? Also, releast date is 08/07/12? And is it full work ?

## 2012-08-06 ENCOUNTER — Telehealth: Payer: Self-pay | Admitting: Internal Medicine

## 2012-08-06 NOTE — Telephone Encounter (Signed)
Pt's spouse dropped off papers needs no restricitions marked in the part b section and needs where 75 lbs is to be marked yes or they will terminate pts job fax to [phone] or call spouse and he will pick up [phone] please mail copy to the pt

## 2012-08-06 NOTE — Telephone Encounter (Signed)
I spoke with pt and she stated MR had restricted her for 10 hour day. 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 Also MR had restricted her to only lift up to 70 lbs. She needs this to stated 75 lbs bc this is requirement by [employer] for everyone. If MR does not stated this on the form she can lift up to 75 LBS then they will not allow her to return to work at all. She is dropping off new form for MR to fill out later this afternoon. Will forward to him as an [place]

## 2012-08-07 NOTE — Telephone Encounter (Signed)
Pt's spouse returned call. He asks that you call him back at [phone]- he has a question re: the wording of letter- also needs this letter "tomorrow". Hazel Sams

## 2012-08-07 NOTE — Telephone Encounter (Signed)
Form located in MR's lookat > not done as of yet.  MR not in office today but will return tomorrow afternoon.  When does this need to be done by? >> LMOM TCB x1.

## 2012-08-09 NOTE — Telephone Encounter (Signed)
Pt returned call & asked to be reached at [phone].  Virginia Richardson

## 2012-08-09 NOTE — Telephone Encounter (Signed)
I spoke with pt and she is calling checking on the status of her paperwork. She stated she is not getting paid while off work and also this has to go through medical review. She would like this done ASAP. Please advise MR thanks

## 2012-08-09 NOTE — Telephone Encounter (Signed)
ATC spouse at # listed NA and no VM wcb

## 2012-08-10 NOTE — Telephone Encounter (Addendum)
Patient's husband asking for a return call.  [phone]

## 2012-08-10 NOTE — Telephone Encounter (Signed)
Dr. Marchelle Gearing I placed the form in your paperwork yesterday to review and sign. Pt is requesting this be done ASAP. Please advise when form will be completed. Thanks. [name], CMA

## 2012-08-14 NOTE — Telephone Encounter (Signed)
Dr. Maple Hudson completed the form. Nothing further needed. [name], CMA

## 2012-08-15 ENCOUNTER — Ambulatory Visit: Payer: BC Managed Care – PPO | Admitting: Internal Medicine

## 2012-09-27 IMAGING — CT CT HEAD W/O CM
2 series · 16 of 30 positions shown, 20 images · non-contrast
Comparison: 09/03/2008

CLINICAL DATA: Altered mental status, syncope

CT HEAD WITHOUT CONTRAST
TECHNIQUE: Contiguous axial images were obtained from the base of
the skull through the vertex without contrast.

[Series 2: head w/o · axial · non-contrast · 0.43mm/px · z∈[-37,+93]mm · 13 of 32 slices shown, 17 images]
[im 3/32  brain]
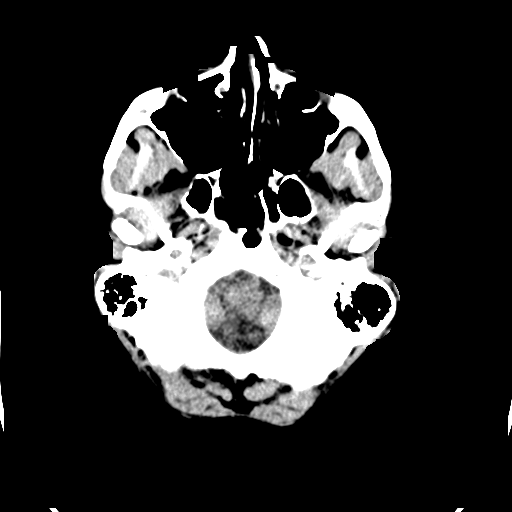
[im 3/32  bone]
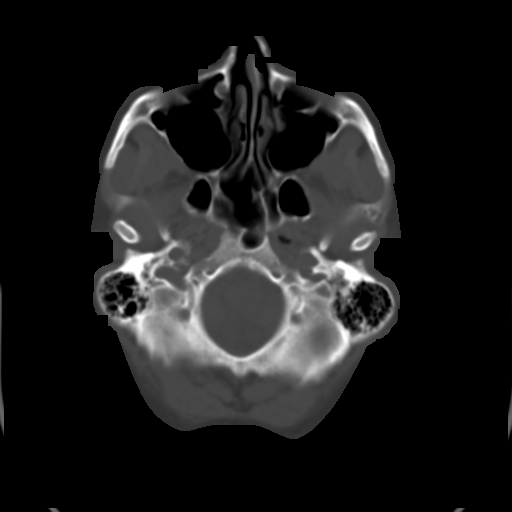
[im 5/32  brain]
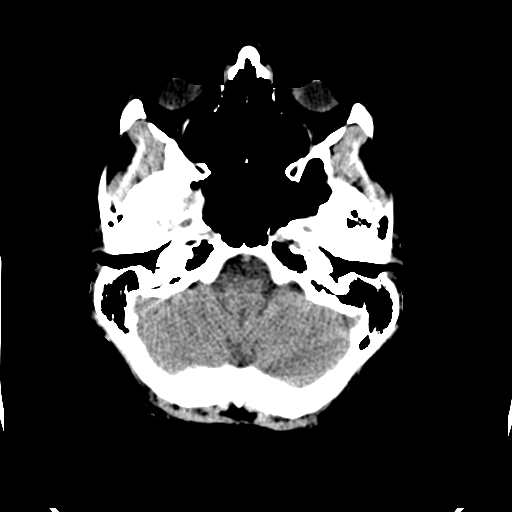
[im 7/32  brain]
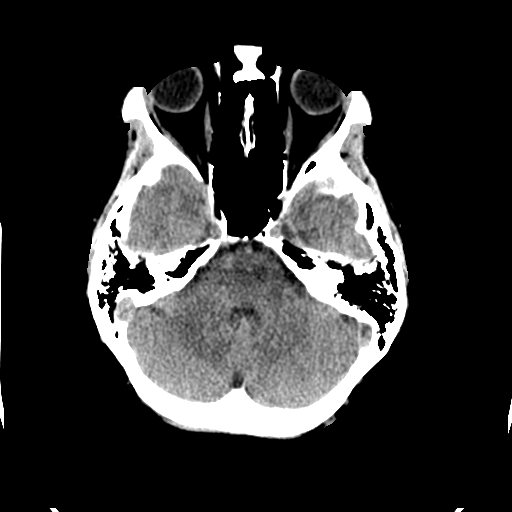
[im 9/32  brain]
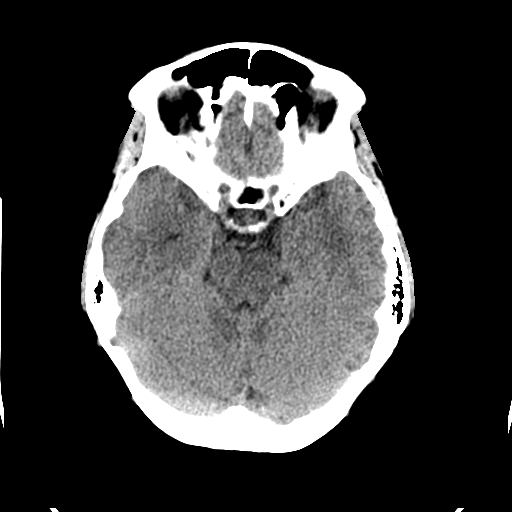
[im 12/32  brain]
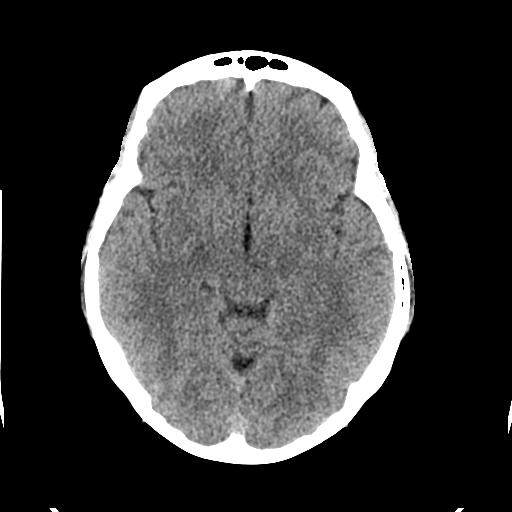
[im 12/32  bone]
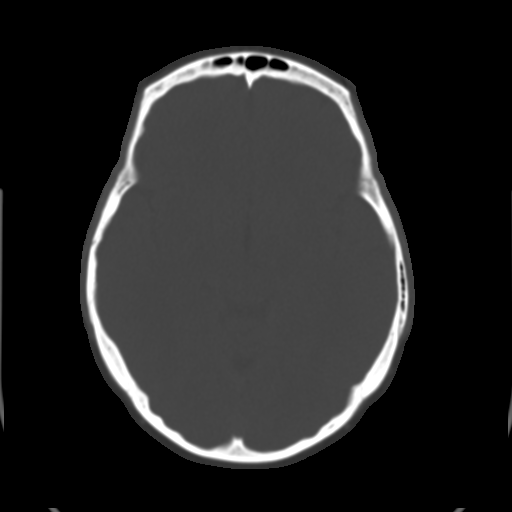
[im 14/32  brain]
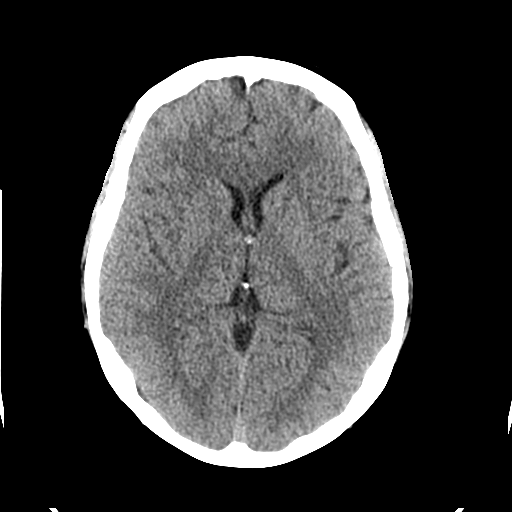
[im 16/32  brain]
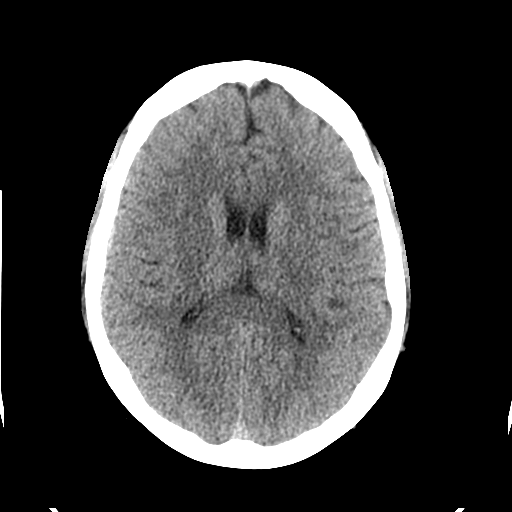
[im 18/32  brain]
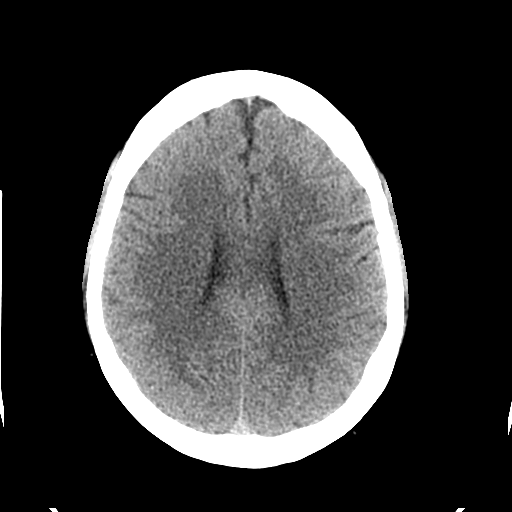
[im 20/32  brain]
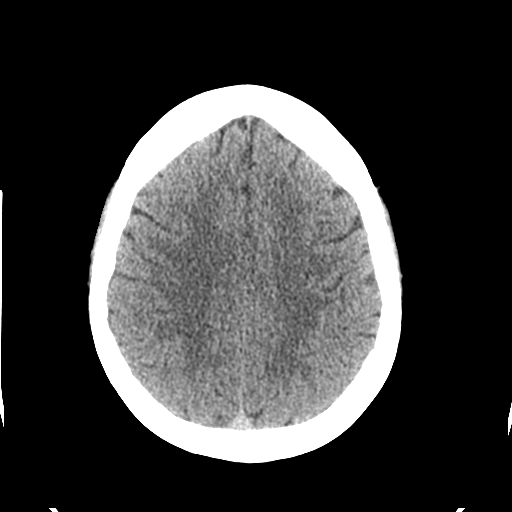
[im 20/32  bone]
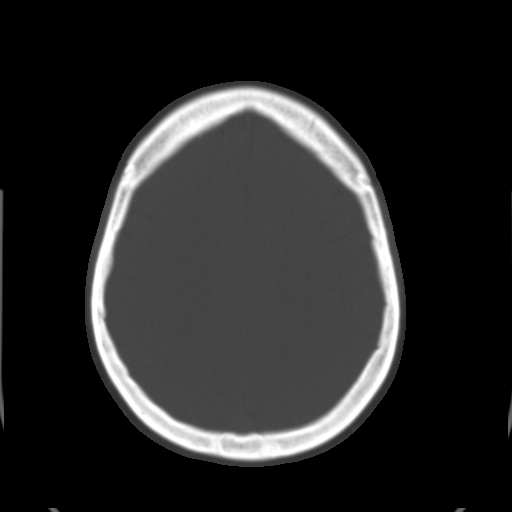
[im 23/32  brain]
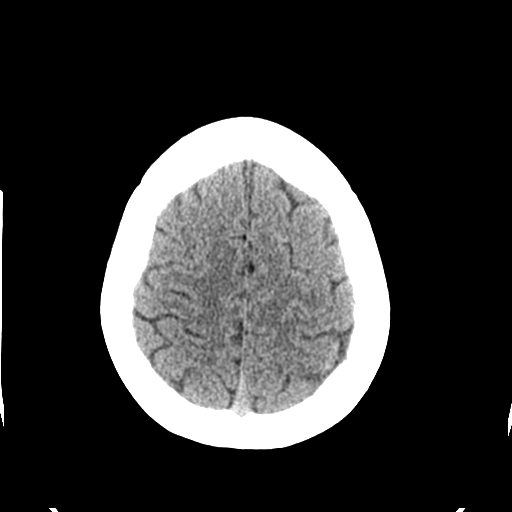
[im 25/32  brain]
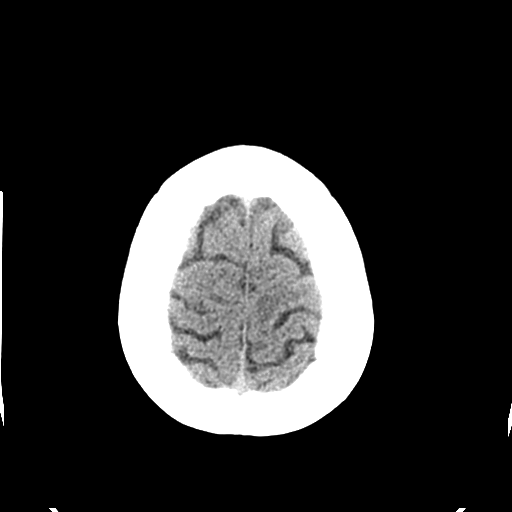
[im 27/32  brain]
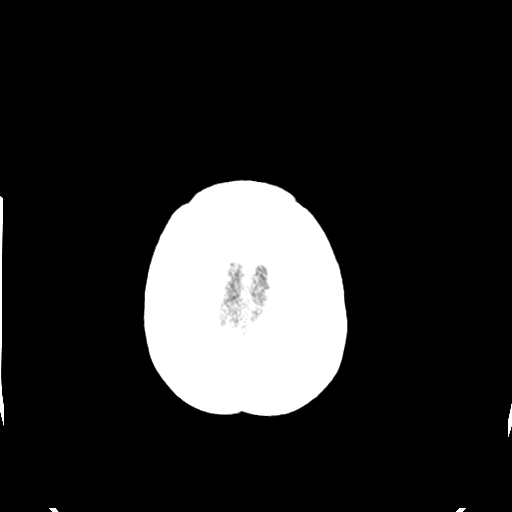
[im 29/32  brain]
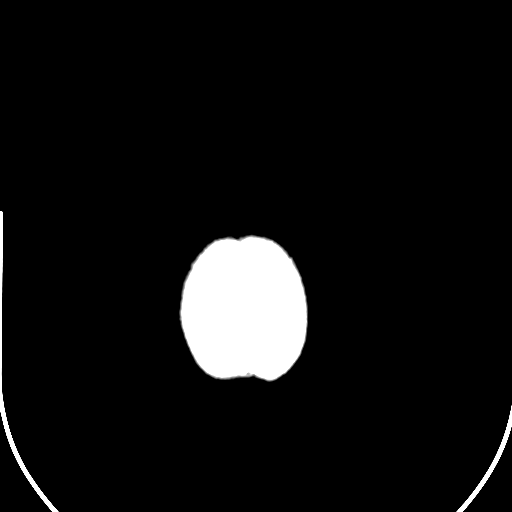
[im 29/32  bone]
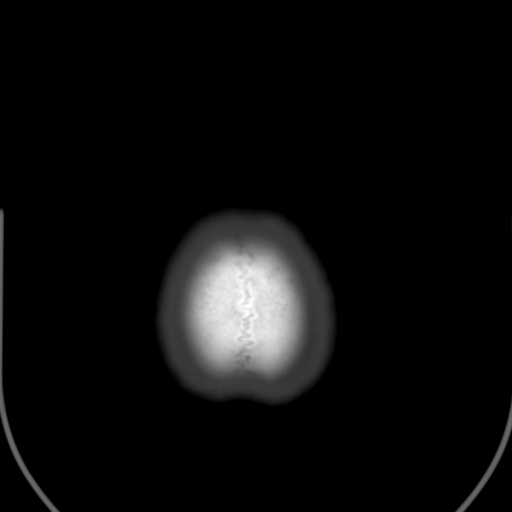

[Series 3: bone windows · axial · 0.43mm/px · z∈[-37,+8]mm · 3 of 32 slices shown]
[im 3/32  bone]
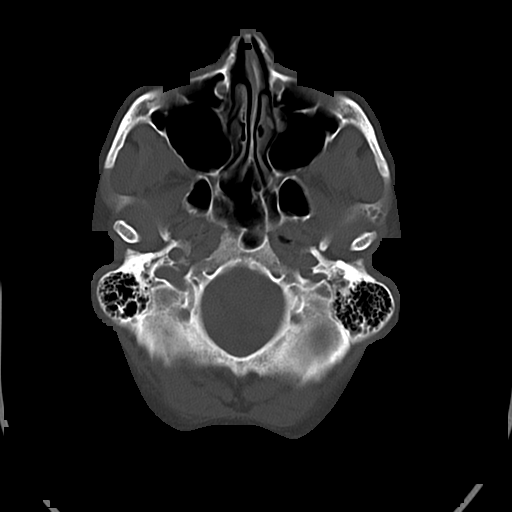
[im 7/32  bone]
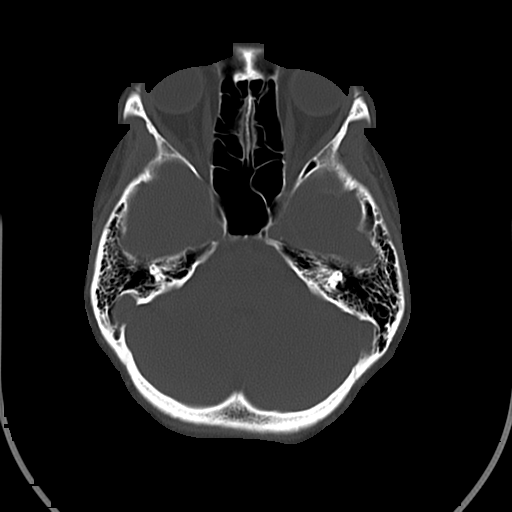
[im 12/32  bone]
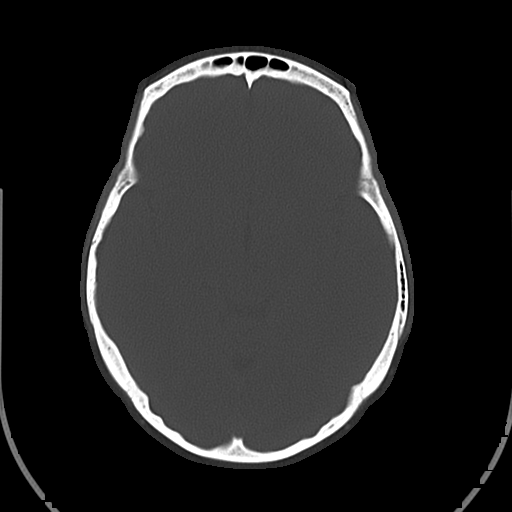

[16 of 30 positions shown; findings below may reference images not displayed]

FINDINGS: No evidence of parenchymal hemorrhage or extra-axial
fluid collection. No mass lesion, mass effect, or midline shift.

No CT evidence of acute infarction.

Cerebral volume is age appropriate.  No ventriculomegaly.

The visualized paranasal sinuses are essentially clear. The mastoid
air cells are unopacified.

No evidence of calvarial fracture.
IMPRESSION: No evidence of acute intracranial abnormality.

## 2012-11-15 ENCOUNTER — Telehealth: Payer: Self-pay | Admitting: Internal Medicine

## 2012-11-15 NOTE — Telephone Encounter (Signed)
Called pt x 3 to make next ov per recall.  Pt never returned our calls.  Mailed recall letter 11/15/12. Emily E McAlister  °

## 2012-12-23 ENCOUNTER — Emergency Department
Admission: EM | Admit: 2012-12-23 | Discharge: 2012-12-23 | Disposition: A | Discharge status: Home / Self Care | Payer: BC Managed Care – PPO | Source: Home / Self Care | Attending: Emergency Medicine | Admitting: Emergency Medicine

## 2012-12-23 DIAGNOSIS — J069 Acute upper respiratory infection, unspecified: Secondary | ICD-10-CM

## 2012-12-23 DIAGNOSIS — J029 Acute pharyngitis, unspecified: Secondary | ICD-10-CM

## 2012-12-23 MED ORDER — AZITHROMYCIN 250 MG PO TABS: ORAL_TABLET | ORAL | Status: DC

## 2012-12-23 NOTE — ED Notes (Signed)
Virginia Richardson complains of dry cough for 1 day. She did have a sore throat 2 days ago but now it feels swollen. Other complaints are body aches, headaches, frequent runny nose, sneezing and hoarseness.

## 2012-12-23 NOTE — ED Provider Notes (Signed)
History     CSN: [id number]  Arrival date & time 12/23/12  1740   First MD Initiated Contact with Patient 12/23/12 1743      Chief Complaint  Patient presents with  . Cough    x 1 day  . Sore Throat    2 days ago    (Consider location/radiation/quality/duration/timing/severity/associated sxs/prior treatment) HPI Virginia Richardson is a 52 y.o. female who complains of onset of cold symptoms for 2-3 days.  The symptoms are constant and mild-moderate in severity.  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  She does follow a pulmonologist.  Not using any OTC meds. + sore throat (exposed to strep 2 weeks ago) + cough (dry) + hoarseness No pleuritic pain No wheezing + nasal congestion + post-nasal drainage + sinus pain/pressure No chest congestion No itchy/red eyes No earache No hemoptysis No SOB No chills/sweats No fever No nausea No vomiting No abdominal pain No diarrhea No skin rashes No fatigue + myalgias +o headache    Past Medical History  Diagnosis Date  . Melanoma     right calf 2008  . Hyperlipidemia     Past Surgical History  Procedure Laterality Date  . Appendectomy    . Carpal tunnel release  2003, 2009    Family History  Problem Relation Age of Onset  . COPD Mother   . Allergies Mother   . Asthma Mother   . Heart disease Mother   . COPD Father   . Asthma Father   . Heart disease Father   . Heart disease Maternal Uncle     History  Substance Use Topics  . Smoking status: Former Smoker -- 1.00 packs/day for 8 years    Types: Cigarettes    Quit date: 11/07/1982  . Smokeless tobacco: Never Used  . Alcohol Use: No    OB History   Grav Para Term Preterm Abortions TAB SAB Ect Mult Living                  Review of Systems  All other systems reviewed and are negative.    Allergies  Review of patient's allergies indicates no known allergies.  Home  Medications   Current Outpatient Rx  Name  Route  Sig  Dispense  Refill  . ALPRAZolam (XANAX) 0.25 MG tablet   Oral   Take 1 tablet (0.25 mg total) by mouth daily as needed for sleep.   10 tablet   1   . DULoxetine (CYMBALTA) 60 MG capsule   Oral   Take 60 mg by mouth daily.         . fish oil-omega-3 fatty acids 1000 MG capsule   Oral   Take 2 g by mouth daily.         . fluticasone (FLONASE) 50 MCG/ACT nasal spray   Each Nare   Place 2 sprays into both nostrils as needed.         Marland Kitchen ibuprofen (ADVIL,MOTRIN) 200 MG tablet   Oral   Take 400 mg by mouth every 6 (six) hours as needed. pain         . loratadine (CLARITIN) 10 MG tablet   Oral   Take 10 mg by mouth daily.         . NON FORMULARY   Oral   Take 20 mg by mouth daily. Vibryd         .  azithromycin (ZITHROMAX Z-PAK) 250 MG tablet      Use as directed   1 each   0     BP 129/88  Pulse 83  Temp(Src) 98 F (36.7 C) (Oral)  Ht 5\' 4"  (1.626 m)  Wt 149 lb (67.586 kg)  BMI 25.56 kg/m2  SpO2 98%  Physical Exam  Nursing note and vitals reviewed. Constitutional: She is oriented to person, place, and time. She appears well-developed and well-nourished.  HENT:  Head: Normocephalic and atraumatic.  Right Ear: Tympanic membrane, external ear and ear canal normal.  Left Ear: Tympanic membrane, external ear and ear canal normal.  Nose: Mucosal edema and rhinorrhea present.  Mouth/Throat: Posterior oropharyngeal erythema (mild) present. No oropharyngeal exudate or posterior oropharyngeal edema.  Eyes: No scleral icterus.  Neck: Neck supple.  Cardiovascular: Regular rhythm and normal heart sounds.   Pulmonary/Chest: Effort normal. No respiratory distress. She has decreased breath sounds (perhaps mild dec L side c/w her history). She has no wheezes. She has no rhonchi. She has no rales.  Neurological: She is alert and oriented to person, place, and time.  Skin: Skin is warm and dry.  Psychiatric: She  has a normal mood and affect. Her speech is normal.    ED Course  Procedures (including critical care time)  Labs Reviewed  POCT RAPID STREP A (OFFICE) - Normal   No results found. Rapid strep negative  1. Sore throat   2. Acute upper respiratory infections of unspecified site       MDM  1)  Take the prescribed antibiotic as instructed.  Currently likely viral, however with history of phrenic nerve damage, will go ahead and treat with a Zpak. 2)  Use nasal saline solution (over the counter) at least 3 times a day. 3)  Use over the counter decongestants like Zyrtec-D every 12 hours as needed to help with congestion.  If you have hypertension, do not take medicines with sudafed.  4)  Can take tylenol every 6 hours or motrin every 8 hours for pain or fever. 5)  Follow up with your primary doctor if no improvement in 5-7 days, sooner if increasing pain, fever, or new symptoms.      Marlaine Hind, MD 12/23/12 [phone]

## 2013-07-03 ENCOUNTER — Telehealth: Payer: Self-pay | Admitting: Internal Medicine

## 2013-07-03 NOTE — Telephone Encounter (Signed)
I spoke with pt. She stated when she takes a deep breathe she has a pain under her ribs. This has been going on for 3 weeks now. I offered appt today but stated she can't d/t work schedule. She is scheduled to come in and see MR on Friday. Nothing further needed

## 2013-07-05 ENCOUNTER — Ambulatory Visit (INDEPENDENT_AMBULATORY_CARE_PROVIDER_SITE_OTHER): Payer: BC Managed Care – PPO | Admitting: Internal Medicine

## 2013-07-05 ENCOUNTER — Encounter: Payer: Self-pay | Admitting: Internal Medicine

## 2013-07-05 ENCOUNTER — Other Ambulatory Visit: Payer: BC Managed Care – PPO

## 2013-07-05 VITALS — BP 118/72 | HR 68 | Temp 97.8°F | Ht 64.0 in | Wt 146.6 lb

## 2013-07-05 DIAGNOSIS — R0789 Other chest pain: Secondary | ICD-10-CM

## 2013-07-05 NOTE — Patient Instructions (Addendum)
Do blood test for blood clot If positive, will do vq scan If negative, will do chest x-ray Will keep you posted on results

## 2013-07-05 NOTE — Progress Notes (Signed)
Subjective:    Patient ID: Virginia Richardson, female    DOB: 04-01-1961, 52 y.o.   MRN: [id number]  HPI 52 year old female. Works in [employer]. No medical issues. Remote smoker - quit 1984 after 8ppd smoking hx.  Reports multiple visits to [facility] to attend to sick mom (UTIs).   Reports dyspnea x 1 month but has gained weight due to hospital food as visitor. Due to mom'sillness did not have time to seek medical attention. Insidious onset. 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 Improved with rest. Stable since onset. Denies associated cough, wheezing, sputum, orthopnea, paroxysmal nocturnal dyspnea, chest pain. However, past few days notices some discomfort in Left infra-axillary region when she exerts or some tightness when she inhales deeply. Also associated hoarseness since 1 month  Mom got out of [facility] on 11/11/11 after opd procedure with supra public foley. Then on 11/12/11 Saturday felt she might be getting acutely ill. Then next day 11/13/11 Sunday nausea, unable to eat, vomiting that persisted on 11/14/11. Yesterday 11/15/11 spent time taking bed rest but noticed after cleaning up old stew off refrigerator. After walking 122feeet for that felt that head was going to split with headache and had nausea. Says last several days not eaten much. Eating only crackers. Taking anti-nausea meds and is helping. Today only had mash potatoes. Of note no fever per hx since Monday 11/14/11  Saw NP Lauretta Chester yesterday and CXR 11/25/11 at DR Kim's office shows new LEFT DIAPHRAGM ELEVATION compared to feb 2012 which was normal at that time (does not remember why cxr was done 1 year ago). CBC was normal. BMET normal. DC on Zpak with referral here.   Social: 13 year old mom. 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  Family: 2 catts. No kids. Spouse + who is non-smoker    OV 11/23/2011  FU dyspnea: still dyspneic. Stil with nausea esp with exposure to certain foods. No  vomiting.  Saw Dr Pollyann Kennedy ENT and per her hx: ENT eval was normal (note not available). Now recollects: that on 10/15/11 tripped and fell missing stairs fell on left side outstretched hand. Initially left ankle sprain and saw Dr Selena Batten 10/17/11 for left ankle sprain (xray negative per hx). Then around 10/24/11 woke up one morning and noticed left shoulder ache - sudden onset when she woke up. Underwent massage x 2 and chiropractor x 2 ( around xmas time to neck and back; felt crack noise). Pain still present but has moved to left supra-mammary area and husband feels this area is more pronounced. Dyspnea noticed after the fall but husband says intermittent nausea mild on and off several months.   REC I think you need MRI of the spine - I will talk to radiologist today and then order it  I will touch base with Dr Selena Batten about yoru nausea and Dr Pollyann Kennedy  My nurse will get Dr Lucky Rathke note  Stay off work till workup is complete  Followup based on MRI  Address insomnia at followup  OV 12/23/2011  Fu for left diphragm weakness/paralysis  with dyspnea out of propprtion. So has had more workup and the are the following  Additional Hx: chiropractor last yr and they were 7/23, 7/24, 10/12, 12/18, and 12/19. - hx of neck mainipulation. Also 15# weight gain iin past 2 months  ENT workup: note of Dr Pollyann Kennedy reviewed. No vocal cord paralysisi  ABG    Component Value Date/Time  PHART 7.407* 11/30/2011 1315   PCO2ART 38.8 11/30/2011 1315   PO2ART 73.2* 11/30/2011 1315   HCO3 24.0 11/30/2011 1315   TCO2 21.2 11/30/2011 1315   ACIDBASEDEF 0.0 11/30/2011 1315   O2SAT 94.4 11/30/2011 1315   Jan/FEb 2013: Saw Dr. Nadara Eaton  And cardiac myoview stress test negative   12/13/11: PFts reviewd. 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   walk test did not show drop in oxygen   Here to discuss all of above. Of note, she nearly passed out afer ABG and went to ER and then referred to cardiology. Still having exertional  dyspnea for carrying cat, doing yard work, pushing grocery cart up incline. In addition, 3 attacks of self described panic attacks - sudden, random, when by herself resting, air hunger and feeling of impending doom and spontaneously settles. No associated tingling.   Also reporting significant GERD since illness srarted. PPI not helping. Taking TUMS. Diet is not great: drinks coffee, tea, and eats fried food.   Husband very concerned  #shortness of breath  - this is due to left diaphragm weakness/paralysis and likely panic attack as well  - I recommend you start pulmonary rehab - nurse will make referral  - I would recommend some weight loss; goal weight is 141# (current weight is 161#) but even if you lose 10# that could help you  - for panic attack try xanax 0.25mg  as needed (total 10 given, no refills). If this is helping you let me know  - I recommend seeing psychiatrist for panic attack as well but we can wait on this as discussed. You can think about this and let me know  - If you wish to pursue more on shortness of breath then next step is a CPST bike test and/or referral to [facility]   #Acid reflux  - continue omeprazole but on empty stomach  - take diet sheet with you   #Followup  2 months    OV 03/22/2012 Fu dyspnea in setting of idiopathic (But presumed due to chiropractic manipulation of neck)  Left diaphragm paralysis and weight gain    - After last visit in FEb 2013 she underwent CPST (results below). Based on that we decided to consider asthma Rx v rehab and opted for rehab and weight loss. She did attend rehab a bit but then discontinued. She returned to work due to $ issues. She never lost weight but seemed to be doing okay but recently past several weeks having dyspnea again. Reporting 2 types: one spontaneous random episodes that does not have all elements of panic but is closest to panic. She did have relief with xanax in past but this time did not take them acutely. Though she  took it the following day as preventative. In addition, after lifiting very heavy boxes and doing yard work got very dyspneic. She and husband are very frustrated by symptoms and are expressing willingness to see DR Harlen [facility] at [facility]  CPST 4/.4/13: Conclusion:   Resting spirometry demonstrates a mild restrictive pattern consistent with her previous PFT evaluation. The MVV is normal. Post-exercise spirometry was performed IPE, 5, 10 (reported above), 15 and 20 minutes of recovery with a borderline reduction in FEV1, which did not meet the criteria for EIB although technically it is positive due to the 10% drop in fev1 (the concomitant decrease in FVC may suggest respiratory muscle fatigue).  The RER of 1.07 indicates a near maximal effort. The peak VO2 is low-normal at 17.6 ml/kg/min (  82% of the age/gender/weight matched sedentary norm). The VO2 a the ventilatory threshold is normal at 54% of the predicted peak VO2. At peak exercise the ventilation was 47% of the measured MVV indicating ventilatory reserve remained (respiratory rate was relatively low at 24 bpm, PETCO2 is normal, Vt/IC is abnormally elevated at 90%). As mentioned above there was an inadequate HR response to the exercise with a HR reserve of 34 bpm. The O2pulse (a surrogate for stroke volume) increased throughout the exercise reaching 10 ml/beat (111% of predicted). The VE/VCO2 slope is mildly elevated indicating mildly reduced ventilatory efficiency. The oxygen uptake efficiency slope (OUES) is low normal and reflects the patient's measured exercise capacity.  Resting spirometry shows restriction which is consistent with diaphramatic paralysis. Exercise testing with gas exchange demonstrates a functional capacity at the lower limit of normal when compared to matched sedentary norms. The patient appears to be limited at peak exercise due to her elevated Vt/IC, which appears to be leading to a sensation of not being able  to get enough air when she inhales. Unclear if there is exercise induced bronchospasm or not. There does not appear to be a circulatory limitation to the exercise.  >referred to [facility]   05/22/2012 Acute OV  Presents for an acute office visit with shortness of breath Patient has known left diaphragm paralysis He was recently seen at [facility] 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 W/ rest breaks  She says she is having great difficulty at work, she has to work 12-13 hr shift with heavy lifting and exposure heat. Today she got very short of breath, lightheaded .  She is very nervous today however she says she is not anxious.  Husband says she has been jittery It was recommended last ov that she see someone for possible anxiety component.  She did not follow up with this. Did use xanax  Briefly with some help.  She is very concerned that her job is so demanding. She feels very exhausted.  No chest pain, edema, visual /speech changes, ext weakness, palpitations.    I will call with xray results.  Advance activity as tolerated.  follow up Dr. Marchelle Gearing as planned and As needed    OV 06/29/2012 followup diaphragm paralysis (unilateral and left) felt due to [facility] syndrom  Presents with husband. Repports significant dyspnea at work while carrying heavy 80# boxes.  She is upset that work is not treating her well despite > 20 year emplyment. Being given difficult routes, long hours. This is stressing her. Denies panic attack style dyspnea but there appears to be element of anticipatory dyspnea as well while needing to lift heavy boxes. Very stressed as a result. Very emotional in office. Sobbing. Husband and she feel they have not reassigned her work  despite my letter. They feel she needs 1 month off from work. She plans to visit with psychiatrist. She has started seeing a psychologist.    Past, Family, Social reviewed: no change since last visit  REC Given extreme symptoms with work and related stress  - take note to be off work for 1 month  - continue to see pscyhologist  - refer to psychiatry for evaluation of potential panic attacks  - join gym and do weight training and high intensity interval  training - get help from trainer initially for this and do not overdo  Return to see me in 6-8 weeks for folllowup    OV 07/20/2012 Body mass index is 26.40 kg/(m^2).   Returns for followup. Since last OV, walking daily and jogging. Able to do walk 2 miles stretches without problems. She reports some weight loss. BMI 07/20/2012 is Body mass index is 26.40 kg/(m^2).  She is yet to [employer] but she is looking at options and evaluting the [employer] and [employer] fitness and evaluating personal trainers. She is talking to  Psychologist. An appt with pscyhiatrist pending end sept 2012.  She plans to return to work around end of this month but is worried about her ability to cope at work and wants to know what the right choice would be.  Past, Family, Social reviewed: no change since last visit     REC Have flu shot today  Please continue to focus on  - 10-15# weight loss through nutrition - eat low glycemic diet - take sheet from [place], talk to Dr Selena Batten about that  - aerobic training - do high intensity interval training 3 times a week with help of training  - resistance training - core and overall muscle strengthening - do with help of trainer  Take xanax 10 tablet with 1 refill  See psychiatrist  Continue seeing psychologist  Return to see me in 4 months or sooner if needed   OV 07/05/2013  Not seen in 1 year. She is a new problem now   Overall she has been doing well. She continues to work at [employer]. They have done some work modifications. She is  a special route. She still has some restricted dyspnea on limits of exertion while carrying some boxes. But otherwise is overall stable. A couple of months ago she slipped in the rain and fell down and sitting she might have had a mild spasm to her lower back but this is improved. However a few weeks ago she started noticing on off cramps and pain in the left inframammary region and left infra-axillary region that is made worse with inspiration. The symptoms are transient but 2 days ago they've persisted for several 8 hours. There is a pleuritic element to this. No otherwise clear cut aggravating or relieving factors. No radiation. Mild to moderate in nature. Nose is a fever, hemoptysis, chest pain, worsening shortness of breath, edema or other  pulmonary embolism risk factors  Body mass index is 25.15 kg/(m^2). indicating some weight loss as advised since last visit   Review of Systems  Constitutional: Negative for fever and unexpected weight change.  HENT: Negative for ear pain, nosebleeds, congestion, sore throat, rhinorrhea, sneezing, trouble swallowing, dental problem, postnasal drip and sinus pressure.   Eyes: Negative for redness and itching.  Respiratory: Positive for shortness of breath. Negative for cough, chest tightness and wheezing.   Cardiovascular: Positive for chest pain. Negative for palpitations and leg swelling.  Gastrointestinal: Negative for nausea and vomiting.  Genitourinary: Negative for dysuria.  Musculoskeletal: Negative for joint swelling.  Skin: Negative for rash.  Neurological: Negative for headaches.  Hematological: Does not bruise/bleed easily.  Psychiatric/Behavioral: Negative for dysphoric mood. The patient is not nervous/anxious.        Objective:   Physical Exam  Vitals reviewed. Constitutional: She is oriented to person, place, and time. She appears well-developed and well-nourished. No distress.  HENT:  Head: Normocephalic and atraumatic.  Right Ear:  External ear normal.  Left  Ear: External ear normal.  Mouth/Throat: Oropharynx is clear and moist. No oropharyngeal exudate.  Eyes: Conjunctivae and EOM are normal. Pupils are equal, round, and reactive to light. Right eye exhibits no discharge. Left eye exhibits no discharge. No scleral icterus.  Neck: Normal range of motion. Neck supple. No JVD present. No tracheal deviation present. No thyromegaly present.  Cardiovascular: Normal rate, regular rhythm, normal heart sounds and intact distal pulses.  Exam reveals no gallop and no friction rub.   No murmur heard. Pulmonary/Chest: Effort normal and breath sounds normal. No respiratory distress. She has no wheezes. She has no rales. She exhibits no tenderness.  ? Normal air entry on left No evidence of muscle knots or spasm on chest  Abdominal: Soft. Bowel sounds are normal. She exhibits no distension and no mass. There is no tenderness. There is no rebound and no guarding.  Musculoskeletal: Normal range of motion. She exhibits no edema and no tenderness.  Lymphadenopathy:    She has no cervical adenopathy.  Neurological: She is alert and oriented to person, place, and time. She has normal reflexes. No cranial nerve deficit. She exhibits normal muscle tone. Coordination normal.  Skin: Skin is warm and dry. No rash noted. She is not diaphoretic. No erythema. No pallor.  Psychiatric: She has a normal mood and affect. Her behavior is normal. Judgment and thought content normal.          Assessment & Plan:

## 2013-07-06 ENCOUNTER — Telehealth: Payer: Self-pay | Admitting: Internal Medicine

## 2013-07-06 DIAGNOSIS — R079 Chest pain, unspecified: Secondary | ICD-10-CM

## 2013-07-06 DIAGNOSIS — R0789 Other chest pain: Secondary | ICD-10-CM | POA: Insufficient documentation

## 2013-07-06 LAB — D-DIMER, QUANTITATIVE: D-Dimer, Quant: 0.27 ug/mL-FEU (ref 0.00–0.48)

## 2013-07-06 NOTE — Assessment & Plan Note (Signed)
Left-sided maxillary and inframammary chest pain sounds more like a muscle spasm from lifting boxes. However, she denies this possibility and also I do not feel any myofascial spasm. We'll do d-dimer test and if this is negative we'll get a chest x-ray at to evaluate for status of left diaphragm. If these are noncontributory then I will order her back to primary care physician for evaluation of nonpulmonary causes of chest pain  She is agreeable with this plan

## 2013-07-06 NOTE — Telephone Encounter (Signed)
Let her know d-dimer 07/04/13 is normal. So, highly unlikely left sided pain due to clot. Have her do CXR 2 view; i have orderd   Dr. Kalman Shan, M.D., [facility].C.P Pulmonary and Critical Care Medicine Staff Physician  [facility] [place] [facility] Pager: [phone], If no answer or between  15:00h - 7:00h: call [phone]  07/06/2013 2:15 PM

## 2013-07-10 ENCOUNTER — Other Ambulatory Visit: Payer: Self-pay | Admitting: Internal Medicine

## 2013-07-10 DIAGNOSIS — M5432 Sciatica, left side: Secondary | ICD-10-CM

## 2013-07-10 NOTE — Telephone Encounter (Signed)
Pt aware. Jennifer Castillo, CMA  

## 2013-07-15 ENCOUNTER — Other Ambulatory Visit: Payer: BC Managed Care – PPO

## 2013-07-15 ENCOUNTER — Ambulatory Visit (INDEPENDENT_AMBULATORY_CARE_PROVIDER_SITE_OTHER)
Admission: RE | Admit: 2013-07-15 | Discharge: 2013-07-15 | Disposition: A | Discharge status: Home / Self Care | Payer: BC Managed Care – PPO | Source: Ambulatory Visit | Attending: Internal Medicine | Admitting: Internal Medicine

## 2013-07-15 DIAGNOSIS — R079 Chest pain, unspecified: Secondary | ICD-10-CM

## 2013-07-24 ENCOUNTER — Telehealth: Payer: Self-pay | Admitting: Internal Medicine

## 2013-07-24 NOTE — Telephone Encounter (Signed)
Let her know that CXR 07/15/13 still unfortunately shows Left diaphragm elevation. I am puzzled what this pain is beyond muscle pull. IF she is worried, we can get a CT chest but I doubt will show anything. Let her know that another patient with similar problem is likely to go to [facility] in [place], [place] to see if there is any options like diaphragm pacing to help. She should call back in 2 months to see if we have learned anything new. Meanwhile, if she is keen on CT chest I can get one  Dr. Kalman Shan, M.D., [facility].C.P Pulmonary and Critical Care Medicine Staff Physician [place] [facility] [place] [facility] and Critical [facility] Pager: [phone], If no answer or between  15:00h - 7:00h: call [phone]  07/24/2013 3:40 AM

## 2013-07-25 NOTE — Telephone Encounter (Signed)
Spoke with the pt and advised. She will hold off on ct at this time.[name], CMA

## 2013-08-05 ENCOUNTER — Other Ambulatory Visit: Payer: BC Managed Care – PPO

## 2014-05-13 IMAGING — CR DG CHEST 2V
2 series · 2 of 2 positions shown · non-contrast
Comparison: 05/22/2012

CLINICAL DATA: Left chest pain for 3 weeks

CHEST - 2 VIEW

[view not recorded (1 of 2)]
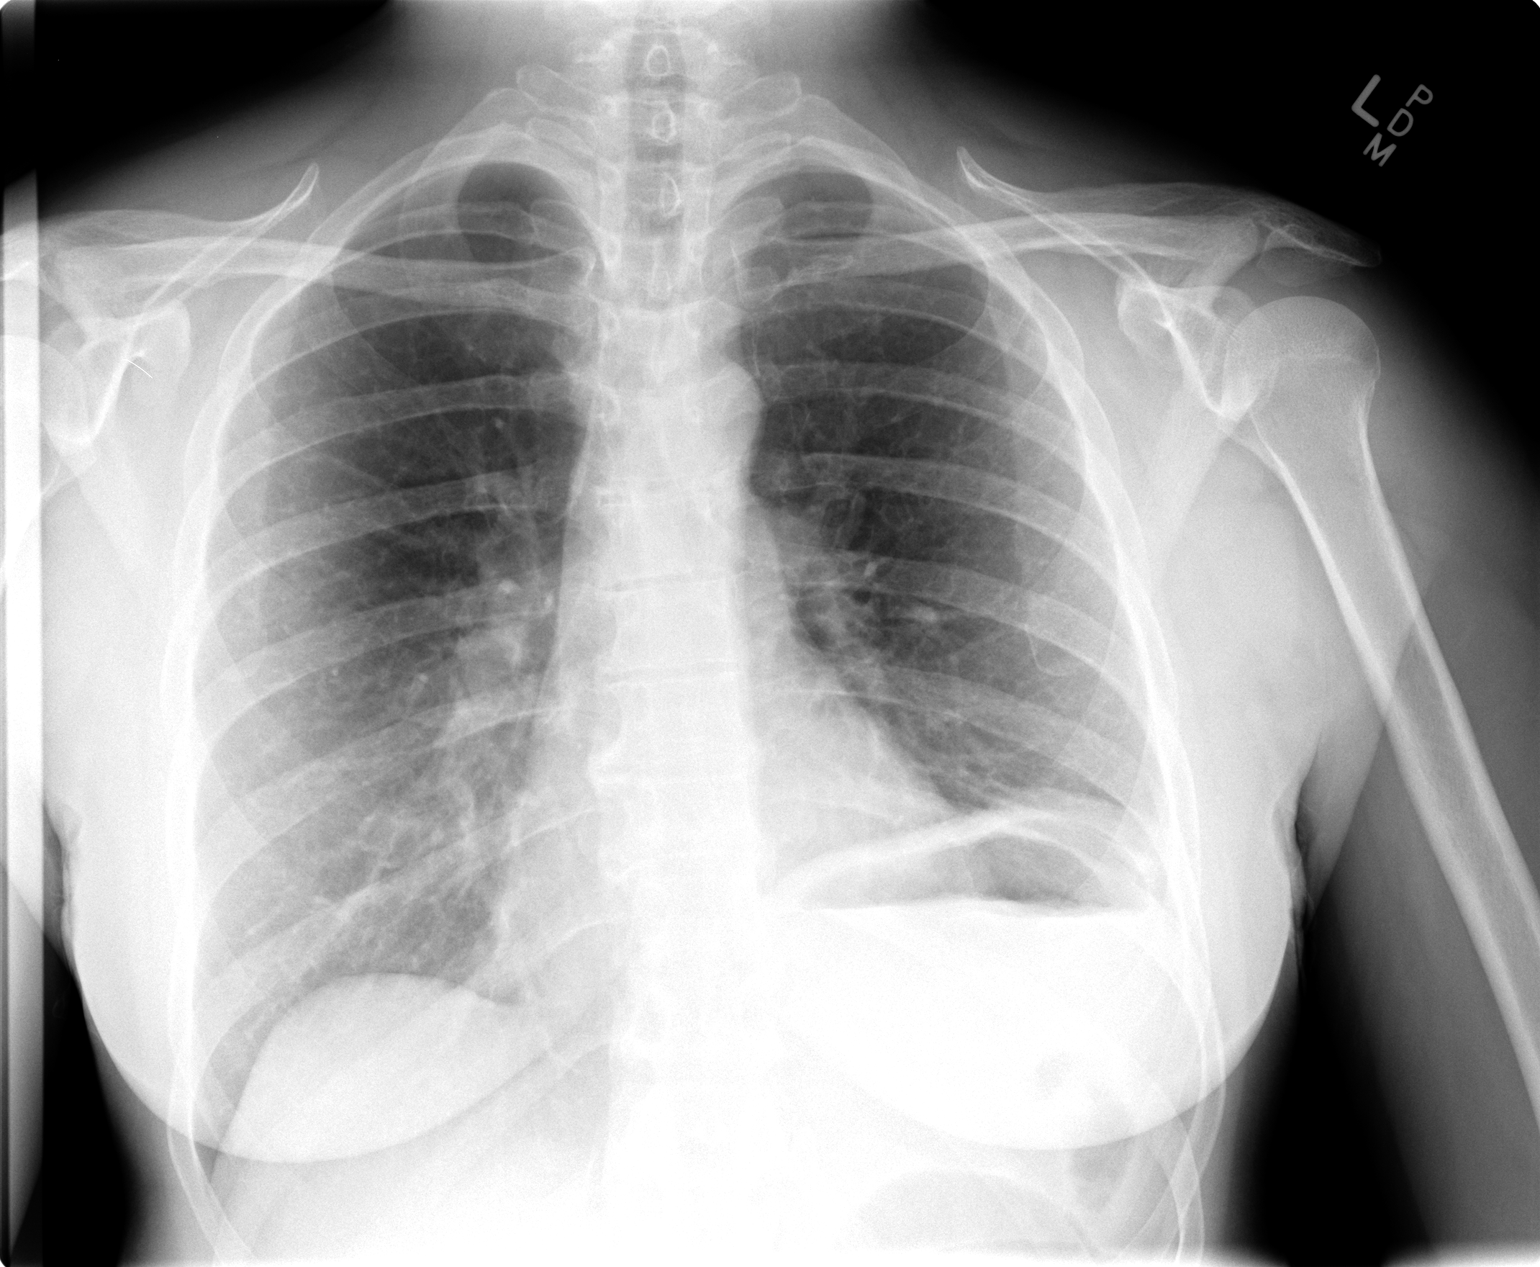

[view not recorded (2 of 2)]
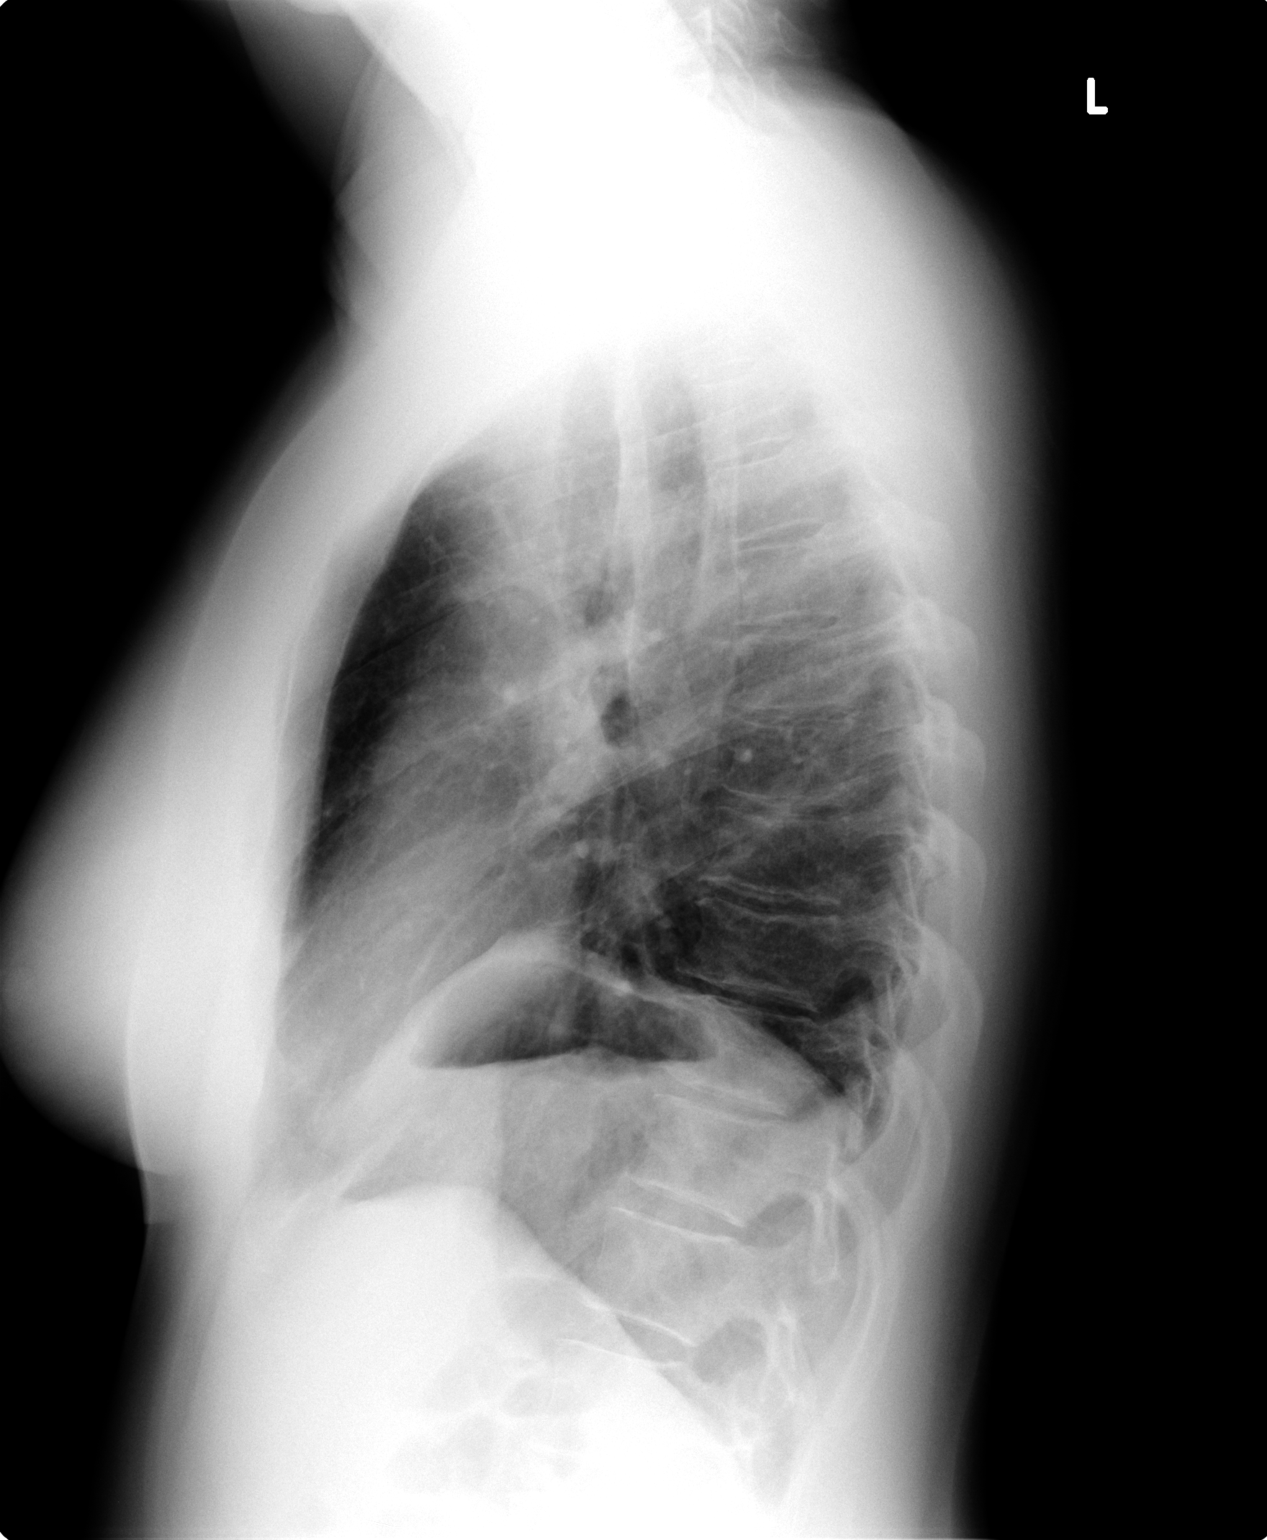

[2 of 2 positions shown; findings below may reference images not displayed]

FINDINGS: Chronic elevation of left diaphragm.
Upper normal heart size.
Normal mediastinal contours and pulmonary vascularity.
Minimal left basilar atelectasis.
Lungs otherwise clear.
No pleural effusion or pneumothorax.
Bones unremarkable.
IMPRESSION: Chronic elevation of left diaphragm with minimal left basilar
atelectasis.

## 2015-01-19 DIAGNOSIS — M51369 Other intervertebral disc degeneration, lumbar region without mention of lumbar back pain or lower extremity pain: Secondary | ICD-10-CM | POA: Insufficient documentation

## 2017-07-12 DIAGNOSIS — M1711 Unilateral primary osteoarthritis, right knee: Secondary | ICD-10-CM | POA: Insufficient documentation

## 2018-02-14 DIAGNOSIS — M5412 Radiculopathy, cervical region: Secondary | ICD-10-CM | POA: Insufficient documentation

## 2018-02-14 DIAGNOSIS — G5601 Carpal tunnel syndrome, right upper limb: Secondary | ICD-10-CM | POA: Insufficient documentation

## 2018-04-06 DIAGNOSIS — M958 Other specified acquired deformities of musculoskeletal system: Secondary | ICD-10-CM | POA: Insufficient documentation

## 2018-04-11 ENCOUNTER — Other Ambulatory Visit (INDEPENDENT_AMBULATORY_CARE_PROVIDER_SITE_OTHER): Payer: Managed Care, Other (non HMO)

## 2018-04-11 ENCOUNTER — Ambulatory Visit (INDEPENDENT_AMBULATORY_CARE_PROVIDER_SITE_OTHER): Payer: Managed Care, Other (non HMO) | Admitting: Internal Medicine

## 2018-04-11 ENCOUNTER — Ambulatory Visit (INDEPENDENT_AMBULATORY_CARE_PROVIDER_SITE_OTHER)
Admission: RE | Admit: 2018-04-11 | Discharge: 2018-04-11 | Disposition: A | Discharge status: Home / Self Care | Payer: Managed Care, Other (non HMO) | Source: Ambulatory Visit | Attending: Internal Medicine | Admitting: Internal Medicine

## 2018-04-11 ENCOUNTER — Encounter: Payer: Self-pay | Admitting: Internal Medicine

## 2018-04-11 VITALS — BP 132/90 | HR 74 | Ht 63.0 in | Wt 148.8 lb

## 2018-04-11 DIAGNOSIS — R0609 Other forms of dyspnea: Secondary | ICD-10-CM

## 2018-04-11 DIAGNOSIS — J986 Disorders of diaphragm: Secondary | ICD-10-CM

## 2018-04-11 LAB — CBC WITH DIFFERENTIAL/PLATELET
BASOS ABS: 0.1 10*3/uL (ref 0.0–0.1)
Basophils Relative: 1 % (ref 0.0–3.0)
Eosinophils Absolute: 0.4 10*3/uL (ref 0.0–0.7)
Eosinophils Relative: 5.6 % — ABNORMAL HIGH (ref 0.0–5.0)
HEMATOCRIT: 42.2 % (ref 36.0–46.0)
Hemoglobin: 14.1 g/dL (ref 12.0–15.0)
LYMPHS PCT: 35.8 % (ref 12.0–46.0)
Lymphs Abs: 2.6 10*3/uL (ref 0.7–4.0)
MCHC: 33.5 g/dL (ref 30.0–36.0)
MCV: 91.9 fl (ref 78.0–100.0)
MONOS PCT: 5.9 % (ref 3.0–12.0)
Monocytes Absolute: 0.4 10*3/uL (ref 0.1–1.0)
Neutro Abs: 3.8 10*3/uL (ref 1.4–7.7)
Neutrophils Relative %: 51.7 % (ref 43.0–77.0)
Platelets: 416 10*3/uL — ABNORMAL HIGH (ref 150.0–400.0)
RBC: 4.59 Mil/uL (ref 3.87–5.11)
RDW: 13.7 % (ref 11.5–15.5)
WBC: 7.3 10*3/uL (ref 4.0–10.5)

## 2018-04-11 LAB — BASIC METABOLIC PANEL
BUN: 14 mg/dL (ref 6–23)
CHLORIDE: 103 meq/L (ref 96–112)
CO2: 29 meq/L (ref 19–32)
Calcium: 9.6 mg/dL (ref 8.4–10.5)
Creatinine, Ser: 0.92 mg/dL (ref 0.40–1.20)
GFR: 66.97 mL/min (ref >60.00)
GLUCOSE: 95 mg/dL (ref 70–99)
POTASSIUM: 3.9 meq/L (ref 3.5–5.1)
Sodium: 139 mEq/L (ref 135–145)

## 2018-04-11 LAB — BRAIN NATRIURETIC PEPTIDE: Pro B Natriuretic peptide (BNP): 19 pg/mL (ref 0.0–100.0)

## 2018-04-11 LAB — TSH: TSH: 1.66 u[IU]/mL (ref 0.35–4.50)

## 2018-04-11 NOTE — Progress Notes (Signed)
Subjective:     Patient ID: Virginia Richardson, female   DOB: Jul 27, 1961,    MRN: [id number]  HPI  60 yowf minimum smoking hx quit  1984 eval by MR for doe assoc with  L HD dysfunction p trauma with min abnormalities on cpst attributed to restrictive changes on 04/10/12  Self referred 04/11/2018 for worse sob p R hand surgery 01/09/18   04/11/2018 1st [facility] office visit/ Wert   Chief Complaint  Patient presents with  . Consult    had a cyst removed, nerve was hit during nerve block and has been SOB every since. Reports SOB all the time with chest tightness. Left sided paralyzed diapragam. MD possibly wants to do nerve transfer.   not aerobic but been very active then surgery R hand 01/09/2018  with nerve block s GA then one week post op  Noted poor activity tol due to doe and sensation of can't get deep breath.  No pleuritic cp/ cough  Has winged scapula from nerve block so trouble moving the R shoulder and R thumb still  numb   Struggles to get thru a grocery store s sob  Sleeps flat on back no cough in am or the rest of the day      NO obvious day to day or daytime variability or assoc excess/ purulent sputum or mucus plugs or hemoptysis or cp or chest tightness, subjective wheeze or overt sinus or hb symptoms. No unusual exposure hx or h/o childhood pna/ asthma or knowledge of premature birth.  Sleeping ok flat without nocturnal  or early am exacerbation  of respiratory  c/o's or need for noct saba. Also denies any obvious fluctuation of symptoms with weather or environmental changes or other aggravating or alleviating factors except as outlined above   Current Allergies, Complete Past Medical History, Past Surgical History, Family History, and Social History were reviewed in [employer] record.  ROS  The following are not active complaints unless bolded Hoarseness, sore throat, dysphagia, dental problems, itching, sneezing,  nasal congestion or discharge of  excess mucus or purulent secretions, ear ache,   fever, chills, sweats, unintended wt loss or wt gain, classically pleuritic or exertional cp,  orthopnea pnd or leg swelling, presyncope, palpitations, abdominal pain, anorexia, nausea, vomiting, diarrhea  or change in bowel habits or change in bladder habits, change in stools or change in urine, dysuria, hematuria,  rash, arthralgias, visual complaints, headache, numbness, weakness or ataxia or problems with walking or coordination,  change in mood/affect or memory.        Current Meds  Medication Sig  . ALPRAZolam (XANAX) 0.25 MG tablet Take 1 tablet (0.25 mg total) by mouth daily as needed for sleep.  . cetirizine (ZYRTEC) 10 MG tablet Take 10 mg by mouth daily.  . DULoxetine (CYMBALTA) 60 MG capsule Take 60 mg by mouth daily.  Marland Kitchen ibuprofen (ADVIL,MOTRIN) 200 MG tablet Take 400 mg by mouth every 6 (six) hours as needed. pain  . meloxicam (MOBIC) 7.5 MG tablet Take 7.5 mg by mouth daily.                        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 Nl external ear canals without cough reflex   NECK :  without JVD/Nodes/TM/ nl carotid upstrokes bilaterally   LUNGS: no acc muscle use,  Nl contour chest with minimal decrease in bs in bases s cough on insp    CV:  RRR  no s3 or murmur or increase in P2, and no edema   ABD:  soft and nontender with nl inspiratory excursion in the supine position on R and slt decrease in L  No bruits or organomegaly appreciated, bowel sounds nl  MS:  Nl gait/ ext warm without  deformities, calf tenderness, cyanosis or clubbing No obvious joint restrictions   SKIN: warm and dry without lesions    NEURO:  alert, approp, nl sensorium with  no motor or cerebellar deficits apparent.          CXR PA and Lateral:   04/11/2018 :    I personally reviewed images and agree with radiology impression as follows:   Mild atelectasis at the right base. Otherwise stable from 01/11/2018.   Labs ordered/ reviewed:      Chemistry      Component Value Date/Time   NA 139 04/11/2018 1129   K 3.9 04/11/2018 1129   CL 103 04/11/2018 1129   CO2 29 04/11/2018 1129   BUN 14 04/11/2018 1129   CREATININE 0.92 04/11/2018 1129      Component Value Date/Time   CALCIUM 9.6 04/11/2018 1129   ALKPHOS 56 11/30/2011 1550   AST 18 11/30/2011 1550   ALT 15 11/30/2011 1550   BILITOT 0.2 (L) 11/30/2011 1550        Lab Results  Component Value Date   WBC 7.3 04/11/2018   HGB 14.1 04/11/2018   HCT 42.2 04/11/2018   MCV 91.9 04/11/2018   PLT 416.0 (H) 04/11/2018       EOS                                                              0.4                                     04/11/2018       Lab Results  Component Value Date   TSH 1.66 04/11/2018     Lab Results  Component Value Date   PROBNP 19.0 04/11/2018      Labs ordered 04/11/2018  D dimer       Assessment:

## 2018-04-11 NOTE — Patient Instructions (Addendum)
To get the most out of exercise, you need to be continuously aware that you are short of breath, but never out of breath, for 30 minutes daily. As you improve, it will actually be easier for you to do the same amount of exercise  in  30 minutes so always push to the level where you are short of breath.    Use incentive spirometry as much as you can now and after surgery   Please remember to go to the lab and x-ray department downstairs in the basement  for your tests - we will call you with the results when they are available.  Pulmonary follow up can be as needed

## 2018-04-11 NOTE — Progress Notes (Signed)
Spoke with pt and notified of results per Dr. Wert. Pt verbalized understanding and denied any questions. 

## 2018-04-12 ENCOUNTER — Encounter: Payer: Self-pay | Admitting: Internal Medicine

## 2018-04-12 LAB — D-DIMER, QUANTITATIVE: D-Dimer, Quant: 0.46 mcg/mL FEU (ref <0.50)

## 2018-04-12 NOTE — Progress Notes (Signed)
Left detailed msg on machine ok per DPR

## 2018-04-12 NOTE — Assessment & Plan Note (Addendum)
Sniff  11/29/11 : There is elevation of the left hemidiaphragm relative to the right.  The patient has minimal excursion of the left hemidiaphragm.  Right hemidiaphragm shows normal motion. -  Spirometry  12/09/11  FEV1 2.33 (83%)  Ratio 83  -  CPST  04/10/12 min abnormalities   attributed to restrictive changes    -  04/11/2018  Walked RA x 3 laps @ 185 ft each stopped due to  End of study,  no sob or desat, moderately fast pace -  Spirometry 04/11/2018  FEV1 1.98 (77%)  Ratio 81     Symptoms are markedly disproportionate to objective findings and not clear to what extent this is actually a pulmonary  problem but pt does appear to have difficult to sort out respiratory symptoms of unknown origin for which  DDX  = almost all start with A and  include Adherence, Ace Inhibitors, Acid Reflux, Active Sinus Disease, Alpha 1 Antitripsin deficiency, Anxiety masquerading as Airways dz,  ABPA,  Allergy(esp in young), Aspiration (esp in elderly), Adverse effects of meds,  Active smokers, A bunch of PE's/clot burden (a few small clots can't cause this syndrome unless there is already severe underlying pulm or vascular dz with poor reserve),  Anemia or thyroid disorder, plus two Bs  = Bronchiectasis and Beta blocker use..and one C= CHF   ? Atelectasis as result of recumbancy for surgery > rx IS / start more regular sub max ex   ? Anxiety/depression  > usually at the bottom of this list of usual suspects but should be much higher on this pt's based on H and P and note already on psychotropics and may interfere with adherence and also interpretation of response or lack thereof to symptom management which can be quite subjective.   ? A bunch of pe's >  D dimer sent >>  while a normal  or high normal value (seen commonly in the elderly or chronically ill)  may miss small peripheral pe, the clot burden with sob is moderately high and the d dimer  has a very high neg pred value if used in this setting.    ? Anemia/ thyroid  dz excluded on today's labs  ? CHF > also excluded by such low bnp   Should be able to tolerate planned surgery fine from a pulmonary perspective so I cleared her for surgery at whatever location NS prefers   Total time devoted to counseling  > 50 % of initial 60 min office visit:  Review extensve case with pt including prior w/u by MR and discussion of options/alternatives/ personally creating written customized instructions  in presence of pt  then going over those specific  Instructions directly with the pt including how to use all of the meds but in particular covering each new medication in detail and the difference between the maintenance= "automatic" meds and the prns using an action plan format for the latter (If this problem/symptom => do that organization reading Left to right).  Please see AVS from this visit for a full list of these instructions which I personally wrote for this pt and  are unique to this visit.

## 2018-04-12 NOTE — Assessment & Plan Note (Addendum)
Study reviewed - the diaphragm has poor excursion but there is no pendeluft so it not by definitely paralyzed but rather paretic would have been a  better term and time has proved this to correct as there has been serial improvement in L HD position to near nl   and  nothing to offer in this setting other than IS/ ex/ keep wt down/ advised

## 2018-08-01 DIAGNOSIS — R2231 Localized swelling, mass and lump, right upper limb: Secondary | ICD-10-CM | POA: Insufficient documentation

## 2019-09-18 DIAGNOSIS — M6702 Short Achilles tendon (acquired), left ankle: Secondary | ICD-10-CM | POA: Insufficient documentation

## 2020-03-13 DIAGNOSIS — W5501XA Bitten by cat, initial encounter: Secondary | ICD-10-CM | POA: Insufficient documentation

## 2020-03-13 DIAGNOSIS — M20011 Mallet finger of right finger(s): Secondary | ICD-10-CM | POA: Insufficient documentation

## 2021-09-15 ENCOUNTER — Other Ambulatory Visit: Payer: Self-pay | Admitting: Registered Nurse

## 2021-09-15 DIAGNOSIS — R739 Hyperglycemia, unspecified: Secondary | ICD-10-CM

## 2021-12-14 ENCOUNTER — Ambulatory Visit
Admission: RE | Admit: 2021-12-14 | Discharge: 2021-12-14 | Disposition: A | Discharge status: Home / Self Care | Payer: Managed Care, Other (non HMO) | Source: Ambulatory Visit | Attending: Registered Nurse | Admitting: Registered Nurse

## 2021-12-14 DIAGNOSIS — R739 Hyperglycemia, unspecified: Secondary | ICD-10-CM

## 2022-01-26 DIAGNOSIS — I7300 Raynaud's syndrome without gangrene: Secondary | ICD-10-CM | POA: Insufficient documentation

## 2022-04-08 ENCOUNTER — Encounter: Payer: Self-pay | Admitting: Cardiology

## 2022-04-08 ENCOUNTER — Ambulatory Visit: Payer: Managed Care, Other (non HMO) | Admitting: Cardiology

## 2022-04-08 VITALS — BP 129/82 | HR 68 | Temp 97.3°F | Resp 16 | Ht 63.0 in | Wt 149.0 lb

## 2022-04-08 DIAGNOSIS — R931 Abnormal findings on diagnostic imaging of heart and coronary circulation: Secondary | ICD-10-CM

## 2022-04-08 DIAGNOSIS — Z7722 Contact with and (suspected) exposure to environmental tobacco smoke (acute) (chronic): Secondary | ICD-10-CM

## 2022-04-08 DIAGNOSIS — E7800 Pure hypercholesterolemia, unspecified: Secondary | ICD-10-CM

## 2022-04-08 DIAGNOSIS — R739 Hyperglycemia, unspecified: Secondary | ICD-10-CM

## 2022-04-08 DIAGNOSIS — N1831 Chronic kidney disease, stage 3a: Secondary | ICD-10-CM

## 2022-04-08 MED ORDER — ASPIRIN 81 MG PO CHEW: 81.0000 mg | CHEWABLE_TABLET | Freq: Every day | ORAL | Status: DC

## 2022-04-08 MED ORDER — ATORVASTATIN CALCIUM 20 MG PO TABS: 20.0000 mg | ORAL_TABLET | Freq: Every day | ORAL | 2 refills | Status: DC

## 2022-04-08 NOTE — Progress Notes (Signed)
EKG 04/08/2022: Normal sinus rhythm with rate of 69 bpm, normal axis.  No evidence of ischemia, normal EKG.

## 2022-04-08 NOTE — Progress Notes (Signed)
Primary Physician/Referring:  Holland Commons, FNP  Patient ID: Virginia Richardson, female    DOB: 02/08/1961, 61 y.o.   MRN: [id number]  Chief Complaint  Patient presents with   Coronary Artery Disease   New Patient (Initial Visit)   HPI:    Virginia Richardson  is a 61 y.o.  Caucasian female patient with family history of premature coronary disease in mother who had MI and CAD and bypass surgery at age 20, maternal uncle died at the age of 73 with massive MI, mild hyperlipidemia, referred to me for evaluation of elevated coronary calcium score in the 92nd percentile.  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  Denies any chest pain or dyspnea.  Past Medical History:  Diagnosis Date   Hyperlipidemia    Melanoma ([place])    right calf 2008   Past Surgical History:  Procedure Laterality Date   APPENDECTOMY     CARPAL TUNNEL RELEASE  2003, 2009   Family History  Problem Relation Age of Onset   Heart failure Mother    COPD Mother    Allergies Mother    Asthma Mother    Heart disease Mother 40       Stents and CABG   Heart failure Father    COPD Father    Asthma Father    Alcoholism Father        Died at age 58   Heart disease Maternal Uncle 25       MI    Social History   Tobacco Use   Smoking status: Former    Packs/day: 1.00    Years: 8.00    Pack years: 8.00    Types: Cigarettes    Quit date: 11/07/1982    Years since quitting: 39.4   Smokeless tobacco: Never  Substance Use Topics   Alcohol use: No   Marital Status: Married  ROS  Review of Systems  Cardiovascular:  Negative for chest pain, dyspnea on exertion and leg swelling.  Objective  Blood pressure 129/82, pulse 68, temperature (!) 97.3 F (36.3 C), temperature source Temporal, resp. rate 16, height 5' 3"  (1.6 m), weight 149 lb (67.6 kg), SpO2 97 %. Body mass index is 26.39 kg/m.     04/08/2022     9:14 AM 04/11/2018   10:03 AM 07/05/2013    3:49 PM  Vitals with BMI  Height 5' 3"  5' 3"  5' 4"   Weight 149 lbs 148 lbs 13 oz 146 lbs 10 oz  BMI 26.4 40.81 44.8  Systolic 185 631 497  Diastolic 82 90 72  Pulse 68 74 68    Physical Exam Neck:     Vascular: No JVD.  Cardiovascular:     Rate and Rhythm: Normal rate and regular rhythm.     Pulses: Intact distal pulses.     Heart sounds: Normal heart sounds. No murmur heard.   No gallop.  Pulmonary:     Effort: Pulmonary effort is normal.     Breath sounds: Normal breath sounds.  Abdominal:     General: Bowel sounds are normal.     Palpations: Abdomen is soft.  Musculoskeletal:     Right lower leg: No edema.     Left lower leg: No edema.    Medications and allergies  No Known Allergies   Medication list after today's encounter  Current Outpatient Medications:    ALPRAZolam (XANAX) 0.25 MG tablet, Take 1 tablet (0.25 mg total) by mouth daily as needed for sleep., Disp: 10 tablet, Rfl: 1   aspirin (ASPIRIN CHILDRENS) 81 MG chewable tablet, Chew 1 tablet (81 mg total) by mouth daily., Disp: , Rfl:    atorvastatin (LIPITOR) 20 MG tablet, Take 1 tablet (20 mg total) by mouth daily., Disp: 30 tablet, Rfl: 2   cetirizine (ZYRTEC) 10 MG tablet, Take 10 mg by mouth daily., Disp: , Rfl:    DULoxetine (CYMBALTA) 60 MG capsule, Take 60 mg by mouth daily., Disp: , Rfl:    ibuprofen (ADVIL,MOTRIN) 200 MG tablet, Take 400 mg by mouth every 6 (six) hours as needed. pain, Disp: , Rfl:   Laboratory examination:   External labs:   Labs 08/04/2021:  A1c 6.1%, TSH normal at 1.87.  Hb 14.4/HCT 43.7, platelets 242, normal indicis.  BUN 15, creatinine 1.07, EGFR 56/65 mL, potassium 5.2, LFTs normal.  Cholesterol 182, triglycerides 103, HDL 59, LDL 102.  Non-HDL cholesterol 11/15/2021.  Radiology:   Chest x-ray AP and lateral view 04/11/2018: Mild linear opacity at the right base. There is no edema, consolidation, effusion, or pneumothorax.  Normal heart size and mediastinal contours.  Cardiac Studies:   Coronary calcium score 12/14/2021: LM 0 LAD 140 in the proximal LAD LCx 0 RCA 0 Total Agatston score 140, MESA database percentile 92.  Ascending and descending thoracic aortic measurements are normal.  Centrilobular emphysema.  No other significant extracardiac abnormalities.  EKG:   EKG 04/08/2022: Normal sinus rhythm with rate of 69 bpm, normal axis.  No evidence of ischemia, normal EKG.    Assessment     ICD-10-CM   1. Elevated coronary artery calcium score 12/14/2021: Total Agatston score 140 (all in Prox LAD), MESA database percentile 92.    R93.1 EKG 12-Lead    Lipoprotein A (LPA)    atorvastatin (LIPITOR) 20 MG tablet    aspirin (ASPIRIN CHILDRENS) 81 MG chewable tablet    PCV MYOCARDIAL PERFUSION WO LEXISCAN    CANCELED: PCV CARDIAC STRESS TEST    2. Pure hypercholesterolemia  E78.00 Lipoprotein A (LPA)    atorvastatin (LIPITOR) 20 MG tablet    PCV MYOCARDIAL PERFUSION WO LEXISCAN    3. Hyperglycemia  R73.9 PCV MYOCARDIAL PERFUSION WO LEXISCAN    4. Stage 3a chronic kidney disease ([place])  N18.31 PCV MYOCARDIAL PERFUSION WO LEXISCAN    5. Second hand tobacco smoke exposure  Z77.22        Medications Discontinued During This Encounter  Medication Reason   meloxicam (MOBIC) 7.5 MG tablet     Meds ordered this encounter  Medications   atorvastatin (LIPITOR) 20 MG tablet    Sig: Take 1 tablet (20 mg total) by mouth daily.    Dispense:  30 tablet    Refill:  2   aspirin (ASPIRIN CHILDRENS) 81 MG chewable tablet    Sig: Chew 1 tablet (81 mg total) by mouth daily.   Orders Placed This Encounter  Procedures   Lipoprotein A (LPA)   PCV MYOCARDIAL PERFUSION WO LEXISCAN    Standing Status:   Future    Standing Expiration Date:   06/08/2022   EKG 12-Lead   Recommendations:   Virginia Richardson is a 61 y.o. Caucasian female patient with family history of premature coronary disease in mother who had MI and CAD  and bypass surgery at age 61, maternal uncle died at the age of 61 with massive MI,  mild hyperlipidemia, referred to me for evaluation of elevated coronary calcium score in the 92nd percentile.  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  Her physical examination is unremarkable.  EKG is normal.  Initially I thought I will order a routine treadmill exercise stress test however upon review of her risk factors, all the coronary calcification is evident in the proximal LAD and would be extremely high risk overall for proximal LAD stenosis hence will perform an exercise nuclear stress test.  I have started her on Lipitor 20 mg daily, goal LDL <55.  Aspirin 81 mg daily for prophylaxis.  She is already on an ACE inhibitor with regard to hypertension and chronic stage IIIa kidney disease and prediabetes mellitus, continue the same for now.  Office visit after the test and I will make further recommendation.    Adrian Prows, MD, [facility] 04/08/2022, 10:03 AM Office: [phone]

## 2022-04-13 LAB — LIPOPROTEIN A (LPA): Lipoprotein (a): 28.9 nmol/L (ref <75.0)

## 2022-05-11 ENCOUNTER — Ambulatory Visit: Payer: Managed Care, Other (non HMO)

## 2022-05-11 DIAGNOSIS — N1831 Chronic kidney disease, stage 3a: Secondary | ICD-10-CM

## 2022-05-11 DIAGNOSIS — E7800 Pure hypercholesterolemia, unspecified: Secondary | ICD-10-CM

## 2022-05-11 DIAGNOSIS — R739 Hyperglycemia, unspecified: Secondary | ICD-10-CM

## 2022-05-11 DIAGNOSIS — R931 Abnormal findings on diagnostic imaging of heart and coronary circulation: Secondary | ICD-10-CM

## 2022-05-19 ENCOUNTER — Ambulatory Visit: Payer: Managed Care, Other (non HMO) | Admitting: Cardiology

## 2022-05-19 ENCOUNTER — Encounter: Payer: Self-pay | Admitting: Cardiology

## 2022-05-19 VITALS — BP 131/87 | HR 69 | Temp 97.2°F | Resp 16 | Ht 63.0 in | Wt 147.2 lb

## 2022-05-19 DIAGNOSIS — E7800 Pure hypercholesterolemia, unspecified: Secondary | ICD-10-CM

## 2022-05-19 DIAGNOSIS — N1831 Chronic kidney disease, stage 3a: Secondary | ICD-10-CM

## 2022-05-19 DIAGNOSIS — R931 Abnormal findings on diagnostic imaging of heart and coronary circulation: Secondary | ICD-10-CM

## 2022-05-19 MED ORDER — ATORVASTATIN CALCIUM 20 MG PO TABS: 20.0000 mg | ORAL_TABLET | Freq: Every day | ORAL | 2 refills | Status: DC

## 2022-05-19 NOTE — Progress Notes (Signed)
Primary Physician/Referring:  Holland Commons, FNP  Patient ID: Virginia Richardson, female    DOB: Oct 14, 1961, 61 y.o.   MRN: [id number]  Chief Complaint  Patient presents with   Coronary Artery Disease   Follow-up   Results   HPI:    Virginia Richardson  is a 61 y.o.  Caucasian female patient with family history of premature coronary disease in mother who had MI and CAD and bypass surgery at age 27, maternal uncle died at the age of 35 with massive MI, mild hyperlipidemia, referred to me for evaluation of elevated coronary calcium score in the 92nd percentile.  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  Denies any chest pain or dyspnea.  I seen her 6 weeks ago and ordered stress test and echocardiogram and also started her on Lipitor.  Unfortunately she did not start Lipitor.  Past Medical History:  Diagnosis Date   Hyperlipidemia    Melanoma ([place])    right calf 2008   Past Surgical History:  Procedure Laterality Date   APPENDECTOMY     CARPAL TUNNEL RELEASE  2003, 2009   Family History  Problem Relation Age of Onset   Heart failure Mother    COPD Mother    Allergies Mother    Asthma Mother    Heart disease Mother 31       Stents and CABG   Heart failure Father    COPD Father    Asthma Father    Alcoholism Father        Died at age 77   Heart disease Maternal Uncle 70       MI    Social History   Tobacco Use   Smoking status: Former    Packs/day: 1.00    Years: 8.00    Total pack years: 8.00    Types: Cigarettes    Quit date: 11/07/1982    Years since quitting: 39.5   Smokeless tobacco: Never  Substance Use Topics   Alcohol use: No   Marital Status: Married  ROS  Review of Systems  Cardiovascular:  Negative for chest pain, dyspnea on exertion and leg swelling.   Objective  Blood pressure 131/87, pulse 69, temperature (!) 97.2 F (36.2 C), temperature  source Temporal, resp. rate 16, height 5' 3"  (1.6 m), weight 147 lb 3.2 oz (66.8 kg), SpO2 99 %. Body mass index is 26.08 kg/m.     05/19/2022    8:16 AM 04/08/2022    9:14 AM 04/11/2018   10:03 AM  Vitals with BMI  Height 5' 3"  5' 3"  5' 3"   Weight 147 lbs 3 oz 149 lbs 148 lbs 13 oz  BMI 26.08 77.4 12.87  Systolic 867 672 094  Diastolic 87 82 90  Pulse 69 68 74    Physical Exam Neck:     Vascular: No JVD.  Cardiovascular:     Rate and Rhythm: Normal rate and regular rhythm.     Pulses: Intact distal pulses.     Heart sounds: Normal heart sounds. No murmur heard.    No gallop.  Pulmonary:     Effort: Pulmonary effort is normal.     Breath sounds: Normal breath sounds.  Abdominal:     General: Bowel sounds are normal.     Palpations: Abdomen is soft.  Musculoskeletal:     Right lower  leg: No edema.     Left lower leg: No edema.     Medications and allergies  No Known Allergies   Medication list after today's encounter   Current Outpatient Medications:    aspirin (ASPIRIN CHILDRENS) 81 MG chewable tablet, Chew 1 tablet (81 mg total) by mouth daily., Disp: , Rfl:    cetirizine (ZYRTEC) 10 MG tablet, Take 10 mg by mouth daily., Disp: , Rfl:    DULoxetine (CYMBALTA) 60 MG capsule, Take 60 mg by mouth daily., Disp: , Rfl:    ibuprofen (ADVIL,MOTRIN) 200 MG tablet, Take 400 mg by mouth every 6 (six) hours as needed. pain, Disp: , Rfl:    LORazepam (ATIVAN) 0.5 MG tablet, Take 0.25-0.5 mg by mouth as needed., Disp: , Rfl:    atorvastatin (LIPITOR) 20 MG tablet, Take 1 tablet (20 mg total) by mouth daily., Disp: 30 tablet, Rfl: 2  Laboratory examination:   External labs:   Labs 08/04/2021:  A1c 6.1%, TSH normal at 1.87.  Hb 14.4/HCT 43.7, platelets 242, normal indicis.  BUN 15, creatinine 1.07, EGFR 56/65 mL, potassium 5.2, LFTs normal.  Cholesterol 182, triglycerides 103, HDL 59, LDL 102.  Non-HDL cholesterol 11/15/2021.  Radiology:   Chest x-ray AP and lateral view  04/11/2018: Mild linear opacity at the right base. There is no edema, consolidation, effusion, or pneumothorax. Normal heart size and mediastinal contours.  Cardiac Studies:   Coronary calcium score 12/14/2021: LM 0 LAD 140 in the proximal LAD LCx 0 RCA 0 Total Agatston score 140, MESA database percentile 92.  Ascending and descending thoracic aortic measurements are normal.  Centrilobular emphysema.  No other significant extracardiac abnormalities.  PCV MYOCARDIAL PERFUSION WO LEXISCAN 05/11/2022  Narrative Exercise Sestamibi stress test 05/11/2022: Exercise nuclear stress test was performed using Bruce protocol. 1 Day Rest and Stress images. Exercise time 7 minutes, achieved 8.39 METS, 88% APMHR. Stress ECG negative for ischemia. Normal myocardial perfusion without evidence of reversible ischemia or prior infarct. Left ventricular size normal, wall thickness preserved, no regional wall motion abnormalities. Calculated LVEF 67%. Low risk study. No prior studies available for comparison.   EKG:   EKG 04/08/2022: Normal sinus rhythm with rate of 69 bpm, normal axis.  No evidence of ischemia, normal EKG.    Assessment     ICD-10-CM   1. Elevated coronary artery calcium score 12/14/2021: Total Agatston score 140 (all in Prox LAD), MESA database percentile 92.    R93.1 atorvastatin (LIPITOR) 20 MG tablet    2. Pure hypercholesterolemia  E78.00 atorvastatin (LIPITOR) 20 MG tablet    3. Stage 3a chronic kidney disease ([facility])  N18.31        Medications Discontinued During This Encounter  Medication Reason   ALPRAZolam (XANAX) 0.25 MG tablet    atorvastatin (LIPITOR) 20 MG tablet Reorder    Meds ordered this encounter  Medications   atorvastatin (LIPITOR) 20 MG tablet    Sig: Take 1 tablet (20 mg total) by mouth daily.    Dispense:  30 tablet    Refill:  2   No orders of the defined types were placed in this encounter.  Recommendations:   Virginia Richardson is a 61 y.o.  Caucasian female patient with family history of premature coronary disease in mother who had MI and CAD and bypass surgery at age 68, maternal uncle died at the age of 72 with massive MI, mild hyperlipidemia, referred to me for evaluation of elevated coronary calcium score in the 92nd percentile.  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  I had seen her 6 weeks ago, I started her on Lipitor 20 mg daily.  However patient did not start this.  I reviewed the results of the stress test and echocardiogram and advised her that in view of strong family history of premature coronary disease, prediabetes mellitus, stage III chronic kidney disease she would get a high risk for future cardiovascular events and that she would greatly benefit from being on Lipitor.  She is willing to start this again, goal LDL <= 55.  Continue aspirin indefinitely.  She is already on an ACE inhibitor for hypertension and stage III chronic kidney disease, continue same.  I will see her back on a as needed basis.     Adrian Prows, MD, [facility] 05/19/2022, 8:57 AM Office: [phone]

## 2022-07-07 ENCOUNTER — Other Ambulatory Visit: Payer: Self-pay | Admitting: Cardiology

## 2022-07-07 DIAGNOSIS — E7800 Pure hypercholesterolemia, unspecified: Secondary | ICD-10-CM

## 2022-07-07 DIAGNOSIS — R931 Abnormal findings on diagnostic imaging of heart and coronary circulation: Secondary | ICD-10-CM

## 2022-10-07 ENCOUNTER — Other Ambulatory Visit: Payer: Self-pay | Admitting: Cardiology

## 2022-10-07 DIAGNOSIS — R931 Abnormal findings on diagnostic imaging of heart and coronary circulation: Secondary | ICD-10-CM

## 2022-10-07 DIAGNOSIS — E7800 Pure hypercholesterolemia, unspecified: Secondary | ICD-10-CM

## 2024-03-29 ENCOUNTER — Other Ambulatory Visit: Payer: Self-pay | Admitting: Family Medicine

## 2024-03-29 ENCOUNTER — Ambulatory Visit
Admission: RE | Admit: 2024-03-29 | Discharge: 2024-03-29 | Disposition: A | Discharge status: Home / Self Care | Payer: Worker's Compensation | Source: Ambulatory Visit | Attending: Family Medicine | Admitting: Family Medicine

## 2024-03-29 DIAGNOSIS — T1490XA Injury, unspecified, initial encounter: Secondary | ICD-10-CM

## 2024-07-11 DIAGNOSIS — M79641 Pain in right hand: Secondary | ICD-10-CM | POA: Insufficient documentation

## 2024-07-11 DIAGNOSIS — S63641A Sprain of metacarpophalangeal joint of right thumb, initial encounter: Secondary | ICD-10-CM | POA: Insufficient documentation

## 2024-07-24 ENCOUNTER — Ambulatory Visit (INDEPENDENT_AMBULATORY_CARE_PROVIDER_SITE_OTHER): Admitting: Sports Medicine

## 2024-07-24 ENCOUNTER — Encounter: Payer: Self-pay | Admitting: Sports Medicine

## 2024-07-24 VITALS — BP 118/82 | HR 69 | Temp 98.1°F | Ht 63.0 in | Wt 139.2 lb

## 2024-07-24 DIAGNOSIS — Z131 Encounter for screening for diabetes mellitus: Secondary | ICD-10-CM

## 2024-07-24 DIAGNOSIS — S0990XA Unspecified injury of head, initial encounter: Secondary | ICD-10-CM

## 2024-07-24 DIAGNOSIS — Z113 Encounter for screening for infections with a predominantly sexual mode of transmission: Secondary | ICD-10-CM | POA: Diagnosis not present

## 2024-07-24 DIAGNOSIS — R270 Ataxia, unspecified: Secondary | ICD-10-CM

## 2024-07-24 DIAGNOSIS — R5383 Other fatigue: Secondary | ICD-10-CM | POA: Diagnosis not present

## 2024-07-24 DIAGNOSIS — F411 Generalized anxiety disorder: Secondary | ICD-10-CM

## 2024-07-24 DIAGNOSIS — G8929 Other chronic pain: Secondary | ICD-10-CM

## 2024-07-24 DIAGNOSIS — Z780 Asymptomatic menopausal state: Secondary | ICD-10-CM

## 2024-07-24 DIAGNOSIS — E785 Hyperlipidemia, unspecified: Secondary | ICD-10-CM

## 2024-07-24 DIAGNOSIS — M25511 Pain in right shoulder: Secondary | ICD-10-CM | POA: Diagnosis not present

## 2024-07-24 DIAGNOSIS — R42 Dizziness and giddiness: Secondary | ICD-10-CM

## 2024-07-24 MED ORDER — ASPIRIN 81 MG PO TBEC: 81.0000 mg | DELAYED_RELEASE_TABLET | Freq: Every day | ORAL | Status: AC

## 2024-07-24 NOTE — Progress Notes (Addendum)
 Careteam: Patient Care Team: Sherlynn Madden, MD as PCP - General (Internal Medicine)  PLACE OF SERVICE:  [facility]  Advanced Directive information    No Known Allergies  Chief Complaint  Patient presents with   Establish Care    Establish Care as a New Patient.  [facility]  Patient declined Flu and COVID vaccines      Discussed the use of AI scribe software for clinical note transcription with the patient, who gave verbal consent to proceed.  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 She reports that she was told she has a partial tear of her rotator cuff and issues with her right thumb. She continues to experience pain in her shoulder, and reports that her range of motion has been generally okay, but she experiences pain with certain movements, despite undergoing physical therapy.    She has a history of anxiety and depression, currently managed with Cymbalta 30 mg. She also uses Ativan intermittently, with her last refill in September and prior to that in January. Her anxiety has been exacerbated by recent life changes. 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  She has a history of vertigo, which has worsened since her fall. She experiences a daily spinning sensation, particularly when working around moving equipment at her job. She reports a feeling of fullness in her left ear, which she associates with past earaches. She has previously been evaluated by ENT specialists, but no definitive cause was found. She experiences nausea when the vertigo occurs but denies any congestion or postnasal drip.    Review of Systems:  Review of Systems  Constitutional:   Negative for chills and fever.  HENT:  Negative for congestion and sore throat.   Eyes:  Negative for double vision.  Respiratory:  Negative for cough, sputum production and shortness of breath.   Cardiovascular:  Negative for chest pain, palpitations and leg swelling.  Gastrointestinal:  Negative for abdominal pain, heartburn and nausea.  Genitourinary:  Negative for dysuria, frequency and hematuria.  Musculoskeletal:  Negative for falls and myalgias.  Neurological:  Negative for dizziness.   Negative unless indicated in HPI.   Past Medical History:  Diagnosis Date   Hyperlipidemia    Melanoma (HCC)    right calf 2008   Past Surgical History:  Procedure Laterality Date   APPENDECTOMY     CARPAL TUNNEL RELEASE  2003, 2009   Social History:   reports that she quit smoking about 41 years ago. Her smoking use included cigarettes. She started smoking about 49 years ago. She has a 8 pack-year smoking history. She has never used smokeless tobacco. She reports that she does not drink alcohol and does not use drugs.  Family History  Problem Relation Age of Onset   Heart failure Mother    COPD Mother    Allergies Mother    Asthma Mother    Heart disease Mother 74       Stents and CABG   Heart failure Father    COPD Father    Asthma Father    Alcoholism Father        Died at age 32   Heart disease Maternal Uncle 53  MI    Medications: Patient's Medications  New Prescriptions   No medications on file  Previous Medications   ASPIRIN  (ASPIRIN  CHILDRENS) 81 MG CHEWABLE TABLET    Chew 1 tablet (81 mg total) by mouth daily.   ATORVASTATIN  (LIPITOR) 20 MG TABLET    TAKE 1 TABLET BY MOUTH EVERY DAY   CETIRIZINE (ZYRTEC) 10 MG TABLET    Take 10 mg by mouth daily.   DULOXETINE (CYMBALTA) 30 MG CAPSULE    Take 30 mg by mouth 2 (two) times daily.   DULOXETINE (CYMBALTA) 60 MG CAPSULE    Take 60 mg by mouth daily.   IBUPROFEN (ADVIL,MOTRIN) 200 MG TABLET    Take 400 mg by mouth  every 6 (six) hours as needed. pain   LORAZEPAM (ATIVAN) 0.5 MG TABLET    Take 0.25-0.5 mg by mouth as needed.   LORAZEPAM (ATIVAN) 0.5 MG TABLET    Take 0.5 mg by mouth 2 (two) times daily.  Modified Medications   No medications on file  Discontinued Medications   No medications on file    Physical Exam: Vitals:   07/24/24 1005  Height: 5' 3 (1.6 m)   Body mass index is 26.08 kg/m. BP Readings from Last 3 Encounters:  05/19/22 131/87  04/08/22 129/82  04/11/18 132/90   Wt Readings from Last 3 Encounters:  05/19/22 147 lb 3.2 oz (66.8 kg)  04/08/22 149 lb (67.6 kg)  04/11/18 148 lb 12.8 oz (67.5 kg)    Physical Exam Constitutional:      Appearance: Normal appearance.  HENT:     Head: Normocephalic and atraumatic.  Cardiovascular:     Rate and Rhythm: Normal rate and regular rhythm.  Pulmonary:     Effort: Pulmonary effort is normal. No respiratory distress.     Breath sounds: Normal breath sounds. No wheezing.  Abdominal:     General: Bowel sounds are normal. There is no distension.     Tenderness: There is no abdominal tenderness. There is no guarding or rebound.     Comments:    Musculoskeletal:        General: No swelling.  Neurological:     Mental Status: She is alert. Mental status is at baseline.     Motor: No weakness.     Comments: Rombergs pos Gait normal, although feels unsteady some times Sensations  Strength Rt arm slightly decreased, has pain  Heel to shin neg No nystagmus EOMI Finger nose neg     Labs reviewed: Basic Metabolic Panel: No results for input(s): NA, K, CL, CO2, GLUCOSE, BUN, CREATININE, CALCIUM , MG, PHOS, TSH in the last 8760 hours. Liver Function Tests: No results for input(s): AST, ALT, ALKPHOS, BILITOT, PROT, ALBUMIN in the last 8760 hours. No results for input(s): LIPASE, AMYLASE in the last 8760 hours. No results for input(s): AMMONIA in the last 8760 hours. CBC: No results for  input(s): WBC, NEUTROABS, HGB, HCT, MCV, PLT in the last 8760 hours. Lipid Panel: No results for input(s): CHOL, HDL, LDLCALC, TRIG, CHOLHDL, LDLDIRECT in the last 8760 hours. TSH: No results for input(s): TSH in the last 8760 hours. A1C: No results found for: HGBA1C  Assessment and Plan Assessment & Plan 1. Chronic right shoulder pain (Primary) Cont with physical therapy Take tylenol prn  Use lidocaine patch Follow up with sports medicine  2. 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 GAD (generalized anxiety disorder) Instructed to take cymbalta  If her symptoms worsens notify us   - CBC (no diff) - Complete Metabolic Panel with eGFR  4. Other fatigue  - CBC (no diff) - Complete Metabolic Panel with eGFR  5. Screening for STD (sexually transmitted disease)  - HIV 1 RNA, Quant, no Reflex - Hepatitis C antibody  6. Postmenopausal estrogen deficiency  - DG Bone Density; Future  7. Screening for diabetes mellitus  - Hemoglobin A1c  8. Hyperlipidemia, unspecified hyperlipidemia type  - Lipid Panel      Other orders - LORazepam (ATIVAN) 0.5 MG tablet; Take 0.5 mg by mouth 2 (two) times daily. - DULoxetine (CYMBALTA) 30 MG capsule; Take 30 mg by mouth 2 (two) times daily. - aspirin  EC 81 MG tablet; Take 1 tablet (81 mg total) by mouth daily. Swallow whole.

## 2024-07-24 NOTE — Patient Instructions (Signed)
 Flonase, claritin daily 2 weeks

## 2024-07-30 ENCOUNTER — Other Ambulatory Visit

## 2024-07-30 ENCOUNTER — Ambulatory Visit: Payer: Self-pay | Admitting: Sports Medicine

## 2024-07-31 ENCOUNTER — Other Ambulatory Visit

## 2024-08-01 ENCOUNTER — Ambulatory Visit
Admission: RE | Admit: 2024-08-01 | Discharge: 2024-08-01 | Disposition: A | Discharge status: Home / Self Care | Payer: Self-pay | Source: Ambulatory Visit | Attending: Sports Medicine | Admitting: Sports Medicine

## 2024-08-01 ENCOUNTER — Ambulatory Visit: Payer: Self-pay | Admitting: Sports Medicine

## 2024-08-01 DIAGNOSIS — R270 Ataxia, unspecified: Secondary | ICD-10-CM

## 2024-08-01 NOTE — Telephone Encounter (Signed)
 The 10-year ASCVD risk score (Arnett DK, et al., 2019) is: 2.8%   Values used to calculate the score:     Age: 63 years     Clincally relevant sex: Female     Is Non-Hispanic African American: No     Diabetic: No     Tobacco smoker: No     Systolic Blood Pressure: 118 mmHg     Is BP treated: No     HDL Cholesterol: 76 mg/dL     Total Cholesterol: 192 mg/dL

## 2024-08-05 MED ORDER — ATORVASTATIN CALCIUM 20 MG PO TABS: 20.0000 mg | ORAL_TABLET | Freq: Every day | ORAL | 3 refills | Status: DC

## 2024-08-07 LAB — HEMOGLOBIN A1C
Hgb A1c MFr Bld: 5.8 % — ABNORMAL HIGH (ref <5.7)
Mean Plasma Glucose: 120 mg/dL
eAG (mmol/L): 6.6 mmol/L

## 2024-08-07 LAB — COMPREHENSIVE METABOLIC PANEL WITH GFR
AG Ratio: 1.9 (calc) (ref 1.0–2.5)
ALT: 19 U/L (ref 6–29)
AST: 18 U/L (ref 10–35)
Albumin: 4.5 g/dL (ref 3.6–5.1)
Alkaline phosphatase (APISO): 56 U/L (ref 37–153)
BUN: 17 mg/dL (ref 7–25)
CO2: 28 mmol/L (ref 20–32)
Calcium: 9.5 mg/dL (ref 8.6–10.4)
Chloride: 104 mmol/L (ref 98–110)
Creat: 0.88 mg/dL (ref 0.50–1.05)
Globulin: 2.4 g/dL (ref 1.9–3.7)
Glucose, Bld: 90 mg/dL (ref 65–99)
Potassium: 4.1 mmol/L (ref 3.5–5.3)
Sodium: 140 mmol/L (ref 135–146)
Total Bilirubin: 0.4 mg/dL (ref 0.2–1.2)
Total Protein: 6.9 g/dL (ref 6.1–8.1)
eGFR: 74 mL/min/1.73m2 (ref >=60)

## 2024-08-07 LAB — LIPID PANEL
Cholesterol: 192 mg/dL (ref <200)
HDL: 76 mg/dL (ref >=50)
LDL Cholesterol (Calc): 97 mg/dL
Non-HDL Cholesterol (Calc): 116 mg/dL (ref <130)
Total CHOL/HDL Ratio: 2.5 (calc) (ref <5.0)
Triglycerides: 96 mg/dL (ref <150)

## 2024-08-07 LAB — HEPATITIS C ANTIBODY: Hepatitis C Ab: NONREACTIVE

## 2024-08-07 LAB — CBC
HCT: 40.7 % (ref 35.0–45.0)
Hemoglobin: 13.1 g/dL (ref 11.7–15.5)
MCH: 31 pg (ref 27.0–33.0)
MCHC: 32.2 g/dL (ref 32.0–36.0)
MCV: 96.2 fL (ref 80.0–100.0)
MPV: 9.6 fL (ref 7.5–12.5)
Platelets: 277 Thousand/uL (ref 140–400)
RBC: 4.23 Million/uL (ref 3.80–5.10)
RDW: 13.4 % (ref 11.0–15.0)
WBC: 5.9 Thousand/uL (ref 3.8–10.8)

## 2024-08-07 LAB — HIV-1 RNA QUANT-NO REFLEX-BLD
HIV 1 RNA Quant: NOT DETECTED {copies}/mL
HIV-1 RNA Quant, Log: NOT DETECTED {Log_copies}/mL

## 2024-08-09 ENCOUNTER — Telehealth: Payer: Self-pay | Admitting: Sports Medicine

## 2024-08-09 ENCOUNTER — Encounter: Payer: Self-pay | Admitting: Rehabilitation

## 2024-08-09 ENCOUNTER — Ambulatory Visit: Attending: Sports Medicine | Admitting: Rehabilitation

## 2024-08-09 ENCOUNTER — Other Ambulatory Visit: Payer: Self-pay

## 2024-08-09 DIAGNOSIS — R42 Dizziness and giddiness: Secondary | ICD-10-CM | POA: Diagnosis present

## 2024-08-09 NOTE — Therapy (Addendum)
 " OUTPATIENT PHYSICAL THERAPY VESTIBULAR EVALUATION / DC SUMMARY     Patient Name: Virginia Richardson MRN: [id number] DOB:[birth year], 63 y.o., female Today's Date: 08/09/2024  END OF SESSION:  PT End of Session - 08/09/24 0925     Visit Number 1    Date for Recertification  10/04/24    PT Start Time 0930    PT Stop Time 1020    PT Time Calculation (min) 50 min    Activity Tolerance Patient tolerated treatment well;No increased pain          Past Medical History:  Diagnosis Date   Hyperlipidemia    Melanoma (HCC)    right calf 2008   Past Surgical History:  Procedure Laterality Date   APPENDECTOMY     CARPAL TUNNEL RELEASE  2003, 2009   Patient Active Problem List   Diagnosis Date Noted   Atypical chest pain 07/06/2013   DOE (dyspnea on exertion) 12/23/2011   GERD (gastroesophageal reflux disease) 12/23/2011   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   She sustained a fall while loading a vehicle with resultant partial RTC tear and vertigo since that time.   She has seen ENT and no cause was found for her vertigo.  She doesn't recall hitting her head when she fell.  She describes her symptoms as intermittent feelings of imbalance and a feeling of fullness in her L ear.  Cervical rotation can bother her at times. States she Feels like she is off balance when getting up from supine in the AM.   Sleeps on L side.  Rolling over in bed is ok.  States feels off balance during the day with unsteadiness.  Feels balance is  constantly impaired.  Turning quickly feels like needs to hold on to something to steady herself.    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   Lives with spouse and has steps to enter her home, but denies any problems on steps.    Pt accompanied by: self  PERTINENT HISTORY: L diaphramatic paresis from trauma, dysnpea on exertion, anxiety, atypical chest pain  PAIN:  Are you having pain? Yes: NPRS scale: 0/10 neck, 3-5/10 R shoulder brief sharp pain with certain positions/movements Pain location: R shoulder Pain description: sharp quick pain Aggravating factors: certain shoulder movements  Relieving factors: avoiding offending movements  PRECAUTIONS: None  RED FLAGS: None   WEIGHT BEARING RESTRICTIONS: No  FALLS: Has patient fallen in last 6 months? Yes. Number of falls 1  LIVING ENVIRONMENT: Lives with: lives with their family and lives with their spouse Lives in: House/apartment Stairs: Yes: External: 5 steps; on right going up Has following equipment at home: None  PLOF: Independent with gait  PATIENT GOALS: not feel dizzy and like I'm going to fall  OBJECTIVE:  Note: Objective measures were completed at Evaluation unless otherwise noted.  DIAGNOSTIC FINDINGS:  EXAM: CT HEAD WITHOUT CONTRAST 08/01/2024 10:08:39 AM   TECHNIQUE: CT of the head was performed without  the administration of intravenous contrast. Automated exposure control, iterative reconstruction, and/or weight based adjustment of the mA/kV was utilized to reduce the radiation dose to as low as reasonably achievable.   COMPARISON: CT of the head dated 11/30/2011.   CLINICAL HISTORY: Ataxia, head trauma; rombergs positive. Vertigo, Left ear pain; Clogged sensation intermittent 1 year.   FINDINGS:   BRAIN AND VENTRICLES: There is a chronic lacunar infarct present within the right thalamus. No acute hemorrhage. No evidence of acute  infarct. No hydrocephalus. No extra-axial collection. No mass effect or midline shift.   ORBITS: No acute abnormality.   SINUSES: No acute abnormality.   SOFT TISSUES AND SKULL: No acute soft tissue abnormality. No skull fracture.   IMPRESSION: 1. No acute intracranial abnormality. 2. Chronic right thalamic lacunar infarct.   Electronically signed by: Evalene Coho MD 08/04/2024 05:11 AM EDT RP Workstation: [id number]     COGNITION: Overall cognitive status: Within functional limits for tasks assessed   SENSATION: WFL  EDEMA:  NT  MUSCLE TONE:  nt  DTRs:  Hyperreflexic KJ BLE  POSTURE:  No Significant postural limitations  Cervical ROM:    Active A/PROM (deg) eval  Flexion   Extension   Right lateral flexion   Left lateral flexion   Right rotation   Left rotation   (Blank rows = not tested)  STRENGTH: 5/5 grossly BUE and BLE except for R shoulder  BED MOBILITY:  Sit to supine Complete Independence Supine to sit Complete Independence  TRANSFERS: Assistive device utilized: None  Sit to stand: Complete Independence Stand to sit: Complete Independence Chair to chair: Complete Independence Floor: NT  GAIT: Gait pattern: WFL Distance walked: into clinic x 200' Assistive device utilized: None Level of assistance: Complete Independence and   Comments: gait appears normal with no deviations detectable  FUNCTIONAL TESTS:  Walking with eyes closed patient deviates outside of straight line by 12-16 inches and looses balance;  backward walking EC is much worse  PATIENT SURVEYS:  DHI: THE DIZZINESS HANDICAP INVENTORY (DHI)  P1. Does looking up increase your problem? 0 = No  E2. Because of your problem, do you feel frustrated? 4 = Yes  F3. Because of your problem, do you restrict your travel for business or recreation?  0 = No  P4. Does walking down the aisle of a supermarket increase your problems?  0 = No  F5. Because of your problem, do you have  difficulty getting into or out of bed?  2 = Sometimes  F6. Does your problem significantly restrict your participation in social activities, such as going out to dinner, going to the movies, dancing, or going to parties? 2 = Sometimes  F7. Because of your problem, do you have difficulty reading?  0 = No  P8. Does performing more ambitious activities such as sports, dancing, household chores (sweeping or putting dishes away) increase your problems?  2 = Sometimes  E9. Because of your problem, are you afraid to leave your home without having without having someone accompany you?  0 = No  E10. Because of your problem have you been embarrassed in front of others?  0 = No  P11. Do quick movements of your head increase your problem?  4 = Yes  F12. Because of your problem, do you avoid heights?  0 = No  P13. Does turning over in bed increase your problem?  0 = No  F14. Because of your problem, is it difficult for you to do strenuous homework or  yard work? 2 = Sometimes  E15. Because of your problem, are you afraid people may think you are intoxicated? 2 = Sometimes  F16. Because of your problem, is it difficult for you to go for a walk by yourself?  0 = No  P17. Does walking down a sidewalk increase your problem?  0 = No  E18.Because of your problem, is it difficult for you to concentrate 4 = Yes  F19. Because of your problem, is it difficult for you to walk around your house in the dark? 2 = Sometimes  E20. Because of your problem, are you afraid to stay home alone?  0 = No  E21. Because of your problem, do you feel handicapped? 2 = Sometimes  E22. Has the problem placed stress on your relationships with members of your family or friends? 0 = No  E23. Because of your problem, are you depressed?  2 = Sometimes  F24. Does your problem interfere with your job or household responsibilities?  4 = Yes  P25. Does bending over increase your problem?  2 = Sometimes  TOTAL 32    DHI Scoring Instructions   The patient is asked to answer each question as it pertains to dizziness or unsteadiness problems, specifically  considering their condition during the last month. Questions are designed to incorporate functional (F), physical  (P), and emotional (E) impacts on disability.   Scores greater than 10 points should be referred to balance specialists for further evaluation.   16-34 Points (mild handicap)  36-52 Points (moderate handicap)  54+ Points (severe handicap)  Minimally Detectable Change: 17 points ([address] [place], 1990)  [place], G. SHAUNNA. and [place], C. W. (1990). The development of the Dizziness Handicap Inventory. Archives of Otolaryngology - Head and Neck Surgery 116(4): [id number].   VESTIBULAR ASSESSMENT:  GENERAL OBSERVATION: normal appearing    SYMPTOM BEHAVIOR:  Subjective history: see note at top  Non-Vestibular symptoms: tinnitus, nausea/vomiting, and headaches  Type of dizziness: Blurred Vision, Spinning/Vertigo, and Unsteady with head/body turns  Frequency: several times daily intermittently  Duration: can last 20-30 min to 3 hours  Aggravating factors: Induced by position change: supine to sit and sit to stand and Worse in the dark  Relieving factors: patient states nothing really relieves, symptoms just subside on their own  Progression of symptoms: unchanged  OCULOMOTOR EXAM:  Ocular Alignment: normal  Ocular ROM: No Limitations  Spontaneous Nystagmus: absent  Gaze-Induced Nystagmus: absent  Smooth Pursuits: intact  Saccades: intact  Convergence/Divergence: WNL cm   VESTIBULAR - OCULAR REFLEX:   Slow VOR: Normal  VOR Cancellation: Normal  Head-Impulse Test: negative B  Dynamic Visual Acuity: Dynamic: can read to 4 lines above last line on eye chart   POSITIONAL TESTING: Right Dix-Hallpike: no nystagmus Left Dix-Hallpike: no nystagmus Right Roll Test: no nystagmus Left Roll Test: no nystagmus Right Sidelying: no nystagmus Left Sidelying: no  nystagmus  MOTION SENSITIVITY:  Motion Sensitivity Quotient Intensity: 0 = none, 1 = Lightheaded, 2 = Mild, 3 = Moderate, 4 = Severe, 5 = Vomiting  Intensity  1. Sitting to supine   2. Supine to L side   3. Supine to R side   4. Supine to sitting   5. L Hallpike-Dix   6. Up from L    7. R Hallpike-Dix   8. Up from R    9. Sitting, head tipped to L knee   10. Head up from L knee   11. Sitting, head tipped to R  knee   12. Head up from R knee   13. Sitting head turns x5   14.Sitting head nods x5   15. In stance, 180 turn to L    16. In stance, 180 turn to R     OTHOSTATICS: not done  FUNCTIONAL GAIT: NT                                                                                                                             TREATMENT DATE: 08/09/2024 SELF CARE: Provided education on PT POC progression with initial HEP     PATIENT EDUCATION: Education details: see medbridge Person educated: Patient Education method: Explanation, Demonstration, Tactile cues, and Verbal cues Education comprehension: verbalized understanding, verbal cues required, tactile cues required, and needs further education  HOME EXERCISE PROGRAM:  GOALS: Goals reviewed with patient? Yes  SHORT TERM GOALS: Target date: 08/09/2024  Patient will be educated in an initial HEP for her symptoms Goal status: MET ASSESSMENT:  CLINICAL IMPRESSION: Patient is a 63 y.o. patient who was seen today for physical therapy evaluation and treatment for vertigo.  Symptoms started after a fall 4 months ago.    She denies hitting her head.   States just has intermittent bouts of dizziness when she cannot walk straight and feels like the room moves and she is naseated.  She denies any falls since the initial fall.   She cannot give any aggravating or relieving factors for her symptoms.   All of her positional testing today is negative for BPPV.   She has good gaze stabilization and eye tracking as well with no  nystagmus present.   I cannot reproduce any of her symptoms today on evaluation.   However, when she closes her eyes, she has a very difficult time maintaining her balance with ambulation, especially backward.  She is unable to ambulate a straight line backward with her eyes closed and deviates over a foot off course.    Her Dizziness Handicap Scale rating indicates a mild handicap.  Her head CT shows a small chronic lacunar thalamic infarct.   Unknown if this could be contributing.  The patient would benefit from PT to address balance deficits.   She is already attending PT at [facility] in [place], [place] for the RTC tear and would like to continue her vestibular/balance rehab at that clinic, too, if possible.   Contacted [facility] [employer] office and spoke with RN and she will speak with the referral coordinator and get the referral sent over to [facility] to continue with her therapy.   Patient is provided with an initial HEP today to address her balance deficits.   NO further visits with us  are planned unless [facility] cannot handle the referral (but I think they can)  OBJECTIVE IMPAIRMENTS: balance impairement.   ACTIVITY LIMITATIONS: locomotion level  PARTICIPATION LIMITATIONS: shopping, community activity, and occupation  PERSONAL FACTORS: Age, Time since onset of injury/illness/exacerbation, and 1-2 comorbidities:  L diaphramatic paresis from trauma, dysnpea on exertion, anxiety, atypical chest pain are also affecting patient's functional outcome.   REHAB POTENTIAL: Good  CLINICAL DECISION MAKING: Evolving/moderate complexity  EVALUATION COMPLEXITY: Moderate   PLAN:  PT FREQUENCY: eval only with [facility]--referral is going to be sent to [facility] in [place] where she is already going  PT DURATION: other: one time visit unless more are needed  PLANNED INTERVENTIONS: [id number]- Self Care and Patient/Family education  PLAN FOR NEXT SESSION: Patient plans to continue PT at [facility]  where she is already going to her R RTC tear rehab.  PHYSICAL THERAPY DISCHARGE SUMMARY  Visits from Start of Care: 1   Current functional level related to goals / functional outcomes: Patient came in for PT eval, but was already going to another PT clinic for RTC problem.  Her referral was sent to this clinic so she could receive PT for her vertigo there as well as her shoulder   Remaining deficits: unchanged   Education / Equipment: Initial HEP provided   Patient agrees to discharge. Patient goals were not met. Patient is being discharged due to already going to PT at another clinic and she continued her treatment for vertigo there ([facility] in [place]).   Travonte Byard, PT 08/09/2024, 1:19 PM  "

## 2024-08-09 NOTE — Telephone Encounter (Signed)
 Garnette from [facility] called and requested that the patient's vertigo referral be redirected to [facility] in [place], , as the patient is already receiving care there.

## 2024-09-02 HISTORY — PX: OTHER SURGICAL HISTORY: SHX169

## 2024-09-04 ENCOUNTER — Telehealth: Payer: Self-pay | Admitting: Sports Medicine

## 2024-09-04 DIAGNOSIS — R42 Dizziness and giddiness: Secondary | ICD-10-CM

## 2024-09-04 NOTE — Telephone Encounter (Signed)
Referral placed to vestibular therapy

## 2024-10-15 ENCOUNTER — Ambulatory Visit: Admitting: Sports Medicine

## 2024-10-15 DIAGNOSIS — Z83719 Family history of colon polyps, unspecified: Secondary | ICD-10-CM | POA: Insufficient documentation

## 2024-10-15 DIAGNOSIS — F419 Anxiety disorder, unspecified: Secondary | ICD-10-CM | POA: Insufficient documentation

## 2024-10-15 DIAGNOSIS — C4490 Unspecified malignant neoplasm of skin, unspecified: Secondary | ICD-10-CM | POA: Insufficient documentation

## 2024-10-16 ENCOUNTER — Ambulatory Visit: Admitting: Family

## 2024-10-18 ENCOUNTER — Ambulatory Visit (INDEPENDENT_AMBULATORY_CARE_PROVIDER_SITE_OTHER): Admitting: Adult Health

## 2024-10-18 ENCOUNTER — Encounter: Payer: Self-pay | Admitting: Adult Health

## 2024-10-18 VITALS — BP 126/78 | HR 78 | Temp 97.1°F | Ht 63.0 in | Wt 142.4 lb

## 2024-10-18 DIAGNOSIS — F321 Major depressive disorder, single episode, moderate: Secondary | ICD-10-CM

## 2024-10-18 DIAGNOSIS — Z124 Encounter for screening for malignant neoplasm of cervix: Secondary | ICD-10-CM | POA: Diagnosis not present

## 2024-10-18 DIAGNOSIS — R7303 Prediabetes: Secondary | ICD-10-CM

## 2024-10-18 DIAGNOSIS — F411 Generalized anxiety disorder: Secondary | ICD-10-CM

## 2024-10-18 MED ORDER — LORAZEPAM 0.5 MG PO TABS: 0.5000 mg | ORAL_TABLET | Freq: Two times a day (BID) | ORAL | 0 refills | Status: DC | PRN

## 2024-10-18 NOTE — Progress Notes (Unsigned)
 [name] clinic  Provider:  Jereld Serum DNP  Code Status:  Full Code  Goals of Care:     08/09/2024   12:56 PM  Advanced Directives  Does Patient Have a Medical Advance Directive? No  Would patient like information on creating a medical advance directive? No - Patient declined     Chief Complaint  Patient presents with   Anxiety    Pt c/o anxiety today ( Pt on Lorazapam 0.5mg  taking as needed). Also would like to see if she may need referral OB/GYN for HRT.    Discussed the use of AI scribe software for clinical note transcription with the patient, who gave verbal consent to proceed.  HPI: Patient is a 63 y.o. female seen today for an acute visit for anxiety.  Discussed the use of AI scribe software for clinical note transcription with the patient, who gave verbal consent to proceed.  History of Present Illness   Virginia Richardson is a 63 year old female who presents for medication management and follow-up for moderate depression and anxiety.  She has a PHQ-9 score of 10 and a GAD-7 score of 14. She experiences panic attacks and uses Ativan 0.5 mg as needed, approximately 15 times a month. She previously took Cymbalta but has weaned off it and is currently trying natural supplements like SAMe and magnesium. She reports no withdrawal symptoms from stopping Cymbalta and feels slightly better without it. No suicidal ideation.  She had surgery on her right thumb due to a fracture that required a screw to reattach the tendon. This injury occurred after a fall while working, which also resulted in a partial tear in her rotator cuff. She has been undergoing physical therapy twice a week for her shoulder, which remains painful and limits her ability to perform certain activities, such as pulling covers over her shoulder at night. She is currently not working due to her injuries and plans to retire due to these circumstances.  She has a history of prediabetes, with her last few blood  tests indicating this condition. She is considering lifestyle changes to manage her blood sugar levels. She occasionally takes naproxen and aspirin  as needed. She does not smoke or drink alcohol and acknowledges the need for regular exercise, which has been limited due to her injuries.  She is married and has a history of working at [employer] and [employer]. 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        Past Medical History:  Diagnosis Date   Hyperlipidemia    Melanoma (HCC)    right calf 2008    Past Surgical History:  Procedure Laterality Date   APPENDECTOMY     CARPAL TUNNEL RELEASE  2003, 2009   thumb surgery Right 09/02/2024    Allergies[1]  Outpatient Encounter Medications as of 10/18/2024  Medication Sig   aspirin  EC 81 MG tablet Take 1 tablet (81 mg total) by mouth daily. Swallow whole.   naproxen sodium (ALEVE) 220 MG tablet Take 220 mg by mouth daily as needed.   [DISCONTINUED] LORazepam (ATIVAN) 0.5 MG tablet Take 0.5 mg by mouth 2 (two) times daily. (Patient taking differently: Take 0.5 mg by mouth as needed for anxiety.)   [DISCONTINUED] meloxicam (MOBIC) 15 MG tablet Take 15 mg by mouth daily.   LORazepam (ATIVAN) 0.5 MG tablet Take 1 tablet (0.5 mg total) by mouth 2 (two) times daily as needed for anxiety.   [DISCONTINUED] atorvastatin  (LIPITOR) 20 MG tablet Take  1 tablet (20 mg total) by mouth daily. (Patient not taking: Reported on 10/18/2024)   [DISCONTINUED] DULoxetine (CYMBALTA) 30 MG capsule Take 30 mg by mouth 2 (two) times daily. (Patient not taking: Reported on 10/18/2024)   [DISCONTINUED] HYDROcodone-acetaminophen (NORCO/VICODIN) 5-325 MG tablet TAKE 1 TABLET BY MOUTH EVERY 4 HOURS TO EVERY 6 HOURS AS NEEDED FOR PAIN FOR 5 DAYS (Patient not taking: Reported on 10/18/2024)   No facility-administered encounter medications on file as of 10/18/2024.    Review of Systems:  Review of Systems  Constitutional:  Negative for appetite change, chills,  fatigue and fever.  HENT:  Negative for congestion, hearing loss, rhinorrhea and sore throat.   Eyes: Negative.   Respiratory:  Negative for cough, shortness of breath and wheezing.   Cardiovascular:  Negative for chest pain, palpitations and leg swelling.  Gastrointestinal:  Negative for abdominal pain, constipation, diarrhea, nausea and vomiting.  Genitourinary:  Negative for dysuria.  Musculoskeletal:  Negative for arthralgias, back pain and myalgias.  Skin:  Negative for color change, rash and wound.  Neurological:  Negative for dizziness, weakness and headaches.  Psychiatric/Behavioral:  Negative for behavioral problems. The patient is not nervous/anxious.     Health Maintenance  Topic Date Due   HIV Screening  Never done   Mammogram  Never done   Colonoscopy  Never done   Zoster Vaccines- Shingrix (1 of 2) 01/16/2025 (Originally [birth year]1980)   Influenza Vaccine  02/04/2025 (Originally 06/07/2024)   Cervical Cancer Screening (HPV/Pap Cotest)  07/24/2025 (Originally 07/21/2008)   Pneumococcal Vaccine: 50+ Years (1 of 1 - PCV) 10/18/2025 (Originally 09/03/2011)   COVID-19 Vaccine (1) 10/23/2025 (Originally 03/03/1962)   DTaP/Tdap/Td (4 - Tdap) 08/22/2032   Hepatitis C Screening  Completed   Hepatitis B Vaccines 19-59 Average Risk  Aged Out   HPV VACCINES  Aged Out   Meningococcal B Vaccine  Aged Out    Physical Exam: Vitals:   10/18/24 1115  BP: 126/78  Pulse: 78  Temp: (!) 97.1 F (36.2 C)  SpO2: 98%  Weight: 142 lb 6.4 oz (64.6 kg)  Height: 5' 3 (1.6 m)   Body mass index is 25.23 kg/m. Physical Exam Constitutional:      Appearance: Normal appearance.  HENT:     Head: Normocephalic and atraumatic.     Nose: Nose normal.     Mouth/Throat:     Mouth: Mucous membranes are moist.  Eyes:     Conjunctiva/sclera: Conjunctivae normal.  Cardiovascular:     Rate and Rhythm: Normal rate and regular rhythm.  Pulmonary:     Effort: Pulmonary effort is normal.     Breath  sounds: Normal breath sounds.  Abdominal:     General: Bowel sounds are normal.     Palpations: Abdomen is soft.  Musculoskeletal:        General: Normal range of motion.     Cervical back: Normal range of motion.  Skin:    General: Skin is warm and dry.  Neurological:     General: No focal deficit present.     Mental Status: She is alert and oriented to person, place, and time.  Psychiatric:        Mood and Affect: Mood normal.        Behavior: Behavior normal.        Thought Content: Thought content normal.        Judgment: Judgment normal.     Labs reviewed: Basic Metabolic Panel: Recent Labs    07/31/24 0931  NA 140  K 4.1  CL 104  CO2 28  GLUCOSE 90  BUN 17  CREATININE 0.88  CALCIUM  9.5   Liver Function Tests: Recent Labs    07/31/24 0931  AST 18  ALT 19  BILITOT 0.4  PROT 6.9   No results for input(s): LIPASE, AMYLASE in the last 8760 hours. No results for input(s): AMMONIA in the last 8760 hours. CBC: Recent Labs    07/31/24 0931  WBC 5.9  HGB 13.1  HCT 40.7  MCV 96.2  PLT 277   Lipid Panel: Recent Labs    07/31/24 0931  CHOL 192  HDL 76  LDLCALC 97  TRIG 96  CHOLHDL 2.5   Lab Results  Component Value Date   HGBA1C 5.8 (H) 07/31/2024    Procedures since last visit: No results found.  Assessment/Plan    Assessment and Plan    Moderate major depressive disorder PHQ-9 score of 10. Prefers to research sertraline before starting. - Continue SAMe and magnesium supplements. - Research sertraline before starting.  Generalized anxiety disorder GAD-7 score of 14. Increased Ativan use due to anxiety. Interested in sertraline as an alternative. - Prescribed Ativan 0.5 mg, 30 tablets per month, as needed. - Signed contract for Ativan prescription. - Research sertraline as an alternative treatment.  Prediabetes A1c indicates prediabetes. Interested in natural remedies. - Encouraged exercise for at least 150 minutes per  week. - Advised low carbohydrate diet. - Consider apple cider vinegar as a natural remedy.  Status post right thumb tendon repair Recent surgery, undergoing physical therapy. - Continue physical therapy for right thumb.  Right shoulder rotator cuff tear Partial tear with pain and movement difficulty. - Continue physical therapy for right shoulder.  General health maintenance Up to date on mammogram and colonoscopy. Interested in hormone replacement therapy. - Referred to OBGYN for cervical cancer screening and hormone evaluation. - Encouraged regular exercise and healthy lifestyle.        Labs/tests ordered:  None   Return in about 6 months (around 04/18/2025), or if symptoms worsen or fail to improve.  Virginia Benoist Medina-Vargas, NP    [1] No Known Allergies

## 2024-10-23 ENCOUNTER — Ambulatory Visit: Payer: Self-pay | Admitting: Family

## 2024-11-26 ENCOUNTER — Other Ambulatory Visit: Payer: Self-pay | Admitting: Adult Health

## 2024-11-26 NOTE — Telephone Encounter (Signed)
 Pharmacy requested refill.  Epic LR: 10/18/2024 Contract Date: 10/18/2024  Pended Rx and sent to [facility] for approval.

## 2025-04-18 ENCOUNTER — Ambulatory Visit: Admitting: Adult Health
